# Patient Record
Sex: Female | Born: 1995 | Race: White | Hispanic: No | Marital: Married | Smoking: Never smoker
Health system: Southern US, Community
[De-identification: ages and names within clinical notes are randomized; demographics above are authoritative.]

## PROBLEM LIST (undated history)

## (undated) DIAGNOSIS — Z789 Other specified health status: Secondary | ICD-10-CM

## (undated) HISTORY — DX: Other specified health status: Z78.9

## (undated) HISTORY — PX: TONSILLECTOMY: SUR1361

---
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
## 2015-05-03 ENCOUNTER — Other Ambulatory Visit: Payer: Self-pay | Admitting: Obstetrics and Gynecology

## 2015-05-03 ENCOUNTER — Other Ambulatory Visit (INDEPENDENT_AMBULATORY_CARE_PROVIDER_SITE_OTHER): Payer: No Typology Code available for payment source | Admitting: *Deleted

## 2015-05-03 DIAGNOSIS — N390 Urinary tract infection, site not specified: Secondary | ICD-10-CM

## 2015-05-03 DIAGNOSIS — R300 Dysuria: Secondary | ICD-10-CM

## 2015-05-03 LAB — POCT URINALYSIS DIPSTICK
BILIRUBIN UA: NEGATIVE
Glucose, UA: NEGATIVE
Ketones, UA: NEGATIVE
LEUKOCYTES UA: NEGATIVE
NITRITE UA: POSITIVE
PROTEIN UA: 100
Spec Grav, UA: 1.01
UROBILINOGEN UA: 0.2
pH, UA: 5

## 2015-05-03 MED ORDER — UROGESIC-BLUE 81.6 MG PO TABS: 1.0000 | ORAL_TABLET | Freq: Four times a day (QID) | ORAL | Status: DC | PRN

## 2015-05-03 MED ORDER — NITROFURANTOIN MONOHYD MACRO 100 MG PO CAPS: 100.0000 mg | ORAL_CAPSULE | Freq: Two times a day (BID) | ORAL | Status: DC

## 2015-05-03 NOTE — Progress Notes (Signed)
Pt dropped urine, c/o burning with urination x 4 days, ua was dipped and specimen was sent for culture

## 2015-05-05 LAB — URINE CULTURE

## 2016-07-24 ENCOUNTER — Encounter: Payer: Self-pay | Admitting: Emergency Medicine

## 2016-07-24 ENCOUNTER — Emergency Department: Payer: 59

## 2016-07-24 ENCOUNTER — Emergency Department
Admission: EM | Admit: 2016-07-24 | Discharge: 2016-07-24 | Disposition: A | Discharge status: Home / Self Care | Payer: 59 | Attending: Emergency Medicine | Admitting: Emergency Medicine

## 2016-07-24 DIAGNOSIS — R197 Diarrhea, unspecified: Secondary | ICD-10-CM | POA: Insufficient documentation

## 2016-07-24 DIAGNOSIS — Z79899 Other long term (current) drug therapy: Secondary | ICD-10-CM | POA: Diagnosis not present

## 2016-07-24 DIAGNOSIS — K2970 Gastritis, unspecified, without bleeding: Secondary | ICD-10-CM | POA: Insufficient documentation

## 2016-07-24 DIAGNOSIS — R1013 Epigastric pain: Secondary | ICD-10-CM

## 2016-07-24 LAB — CBC
HEMATOCRIT: 41.7 % (ref 35.0–47.0)
HEMOGLOBIN: 14.6 g/dL (ref 12.0–16.0)
MCH: 30.5 pg (ref 26.0–34.0)
MCHC: 34.9 g/dL (ref 32.0–36.0)
MCV: 87.5 fL (ref 80.0–100.0)
Platelets: 190 10*3/uL (ref 150–440)
RBC: 4.76 MIL/uL (ref 3.80–5.20)
RDW: 13.8 % (ref 11.5–14.5)
WBC: 6.9 10*3/uL (ref 3.6–11.0)

## 2016-07-24 LAB — COMPREHENSIVE METABOLIC PANEL
ALK PHOS: 58 U/L (ref 38–126)
ALT: 23 U/L (ref 14–54)
ANION GAP: 7 (ref 5–15)
AST: 28 U/L (ref 15–41)
Albumin: 4.3 g/dL (ref 3.5–5.0)
BUN: 13 mg/dL (ref 6–20)
CALCIUM: 8.9 mg/dL (ref 8.9–10.3)
CHLORIDE: 107 mmol/L (ref 101–111)
CO2: 24 mmol/L (ref 22–32)
Creatinine, Ser: 0.55 mg/dL (ref 0.44–1.00)
GFR calc non Af Amer: >60 mL/min (ref >60)
Glucose, Bld: 111 mg/dL — ABNORMAL HIGH (ref 65–99)
POTASSIUM: 3.5 mmol/L (ref 3.5–5.1)
SODIUM: 138 mmol/L (ref 135–145)
Total Bilirubin: 0.2 mg/dL — ABNORMAL LOW (ref 0.3–1.2)
Total Protein: 8.1 g/dL (ref 6.5–8.1)

## 2016-07-24 LAB — URINALYSIS COMPLETE WITH MICROSCOPIC (ARMC ONLY)
Bacteria, UA: NONE SEEN
Bilirubin Urine: NEGATIVE
Glucose, UA: NEGATIVE mg/dL
Leukocytes, UA: NEGATIVE
Nitrite: NEGATIVE
PH: 6 (ref 5.0–8.0)
PROTEIN: 30 mg/dL — AB
SPECIFIC GRAVITY, URINE: 1.025 (ref 1.005–1.030)

## 2016-07-24 LAB — LIPASE, BLOOD: LIPASE: 28 U/L (ref 11–51)

## 2016-07-24 MED ORDER — PANTOPRAZOLE SODIUM 20 MG PO TBEC: 20.0000 mg | DELAYED_RELEASE_TABLET | Freq: Every day | ORAL | 1 refills | Status: DC

## 2016-07-24 MED ORDER — GI COCKTAIL ~~LOC~~
30.0000 mL | Freq: Once | ORAL | Status: AC
  Administered 2016-07-24: 30 mL via ORAL
  Filled 2016-07-24: qty 30

## 2016-07-24 NOTE — ED Provider Notes (Signed)
Time Seen: Approximately *1641 I have reviewed the triage notes  Chief Complaint: Abdominal Pain   History of Present Illness: Jodi Sanders is a 20 y.o. female who describes epigastric abdominal pain that started over the weekend. She states Friday she had nausea, vomiting, and some loose stool. She states then the epigastric pain seemed to occur without any obvious melena or hematochezia. Eyes any lower abdominal pain and points exclusively to the epigastric area and states it does hurt her to take a deep breath. She denies any chest pain per se and denies any back or flank discomfort. She denies any shoulder discomfort   No past medical history on file.  There are no active problems to display for this patient.   Past Surgical History:  Procedure Laterality Date  . TONSILLECTOMY      Past Surgical History:  Procedure Laterality Date  . TONSILLECTOMY      Current Outpatient Rx  . Order #: [id number] Class: Normal  . Order #: [id number] Class: Normal  . Order #: [id number] Class: Print    Allergies:  Review of patient's allergies indicates no known allergies.  Family History: No family history on file.  Social History: Social History  Substance Use Topics  . Smoking status: Never Smoker  . Smokeless tobacco: Never Used  . Alcohol use Yes     Comment: occasionally     Review of Systems:   10 point review of systems was performed and was otherwise negative:  Constitutional: Patient felt like she had a fever over the weekend Eyes: No visual disturbances ENT: No sore throat, ear pain Cardiac: No chest pain Respiratory: No shortness of breath, wheezing, or stridor Abdomen: Epigastric pain with no obvious current nausea and vomiting or loose stool. Endocrine: No weight loss, No night sweats Extremities: No peripheral edema, cyanosis Skin: No rashes, easy bruising Neurologic: No focal weakness, trouble with speech or swollowing Urologic: No dysuria, Hematuria,  or urinary frequency Patient denies any foodborne exposure  Physical Exam:  ED Triage Vitals  Enc Vitals Group     BP 07/24/16 1545 (!) 103/59     Pulse Rate 07/24/16 1545 (!) 101     Resp 07/24/16 1545 16     Temp 07/24/16 1545 98.7 F (37.1 C)     Temp Source 07/24/16 1545 Oral     SpO2 07/24/16 1545 98 %     Weight 07/24/16 1546 145 lb (65.8 kg)     Height 07/24/16 1546 5\' 8"  (1.727 m)     Head Circumference --      Peak Flow --      Pain Score 07/24/16 1554 6     Pain Loc --      Pain Edu? --      Excl. in GC? --     General: Awake , Alert , and Oriented times 3; GCS 15 Head: Normal cephalic , atraumatic Eyes: Pupils equal , round, reactive to light Nose/Throat: No nasal drainage, patent upper airway without erythema or exudate.  Neck: Supple, Full range of motion, No anterior adenopathy or palpable thyroid masses Lungs: Clear to ascultation without wheezes , rhonchi, or rales Heart: Regular rate, regular rhythm without murmurs , gallops , or rubs Abdomen: Tender primarily in the epigastric area without rebound, guarding, or rigidity. Bowel sounds are positive and symmetric in all 4 quadrants. Negative Murphy's sign, negative tenderness over McBurney's point      Extremities: 2 plus symmetric pulses. No edema, clubbing or cyanosis Neurologic: normal ambulation,  Motor symmetric without deficits, sensory intact Skin: warm, dry, no rashes   Labs:   All laboratory work was reviewed including any pertinent negatives or positives listed below:  Labs Reviewed  COMPREHENSIVE METABOLIC PANEL - Abnormal; Notable for the following:       Result Value   Glucose, Bld 111 (*)    Total Bilirubin 0.2 (*)    All other components within normal limits  URINALYSIS COMPLETEWITH MICROSCOPIC ([facility] ONLY) - Abnormal; Notable for the following:    Color, Urine YELLOW (*)    APPearance CLEAR (*)    Ketones, ur TRACE (*)    Hgb urine dipstick 2+ (*)    Protein, ur 30 (*)    Squamous  Epithelial / LPF 0-5 (*)    All other components within normal limits  LIPASE, BLOOD  CBC  PREGNANCY, URINE  Laboratory work was reviewed and showed no clinically significant abnormalities.   EKG:  ED ECG REPORT I, Jodi Sanders, the attending physician, personally viewed and interpreted this ECG.  Date: 07/24/2016 EKG Time: 1617 Rate: 99 Rhythm: normal sinus rhythm QRS Axis: normal Intervals: normal ST/T Wave abnormalities: normal Conduction Disturbances: none Narrative Interpretation: unremarkable Normal EKG   Radiology:  "US Abdomen Limited Ruq  Result Date: 07/24/2016 CLINICAL DATA:  Acute epigastric pain. EXAM: US ABDOMEN LIMITED - RIGHT UPPER QUADRANT COMPARISON:  None. FINDINGS: Gallbladder: No gallstones or wall thickening visualized. No sonographic Murphy sign noted by sonographer. Common bile duct: Diameter: 2 mm Liver: No focal lesion identified. Within normal limits in parenchymal echogenicity. IMPRESSION: Normal RIGHT upper quadrant ultrasound. Electronically Signed   By: Awilda Metro M.D.   On: 07/24/2016 17:24  "  I personally reviewed the radiologic studies     ED Course:  Patient's stay here was uneventful and given her differential I felt this was either gastritis versus acute cholecystitis, pancreatitis, etc. Given her clinical presentation and objective findings this most likely is some gastritis after an episode of nausea vomiting and diarrhea. Patient had some relief with a GI cocktail. I felt was unlikely this was an intrathoracic source for the patient's discomfort does not appear to have any evidence of any gastric or duodenal ulcer at this time Clinical Course     Assessment:  Gastritis   Final Clinical Impression  Final diagnoses:  Acute epigastric pain  Epigastric pain  Gastritis     Plan: * Outpatient " New Prescriptions   PANTOPRAZOLE (PROTONIX) 20 MG TABLET    Take 1 tablet (20 mg total) by mouth daily.  " Patient was  advised to avoid ibuprofen and Aleve which she has been taking for pain as this may be making the gastritis worse. She was advised take Tylenol over-the-counter. Patient was advised to return immediately if condition worsens. Patient was advised to follow up with their primary care physician or other specialized physicians involved in their outpatient care. The patient and/or family member/power of attorney had laboratory results reviewed at the bedside. All questions and concerns were addressed and appropriate discharge instructions were distributed by the nursing staff.             Jodi Moccasin, MD 07/24/16 [phone]

## 2016-07-24 NOTE — ED Triage Notes (Signed)
Pt to ED from home c/o epigastric pain since last Thursday.  Pt reports pain today when breathing and a full feeling.  Pt states diarrhea and nausea/vomiting started last week.  Pt states pain is worse after eating.

## 2016-07-24 NOTE — Discharge Instructions (Signed)
Please return immediately if condition worsens. Please contact her primary physician or the physician you were given for referral. If you have any specialist physicians involved in her treatment and plan please also contact them. Thank you for using [facility] [place] [facility].  Return especially for a fever, increasing pain, vomiting blood, or dark tarry looking stool. Over-the-counter Tylenol for pain.

## 2016-08-01 MED ORDER — ALBUTEROL SULFATE (2.5 MG/3ML) 0.083% IN NEBU
INHALATION_SOLUTION | RESPIRATORY_TRACT | Status: AC
  Filled 2016-08-01: qty 3

## 2016-08-07 MED ORDER — LIDOCAINE-EPINEPHRINE-TETRACAINE (LET) SOLUTION
NASAL | Status: AC
  Filled 2016-08-07: qty 3

## 2017-03-28 ENCOUNTER — Encounter: Payer: No Typology Code available for payment source | Admitting: Obstetrics and Gynecology

## 2018-02-18 IMAGING — US US ABDOMEN LIMITED
1 series · 14 of 25 positions shown · non-contrast
Comparison: None.

CLINICAL DATA: Acute epigastric pain.

EXAM:
US ABDOMEN LIMITED - RIGHT UPPER QUADRANT

[Series 1: us abdomen limited · 0.17mm/px · 14 of 44 slices shown]
[im 1/44]
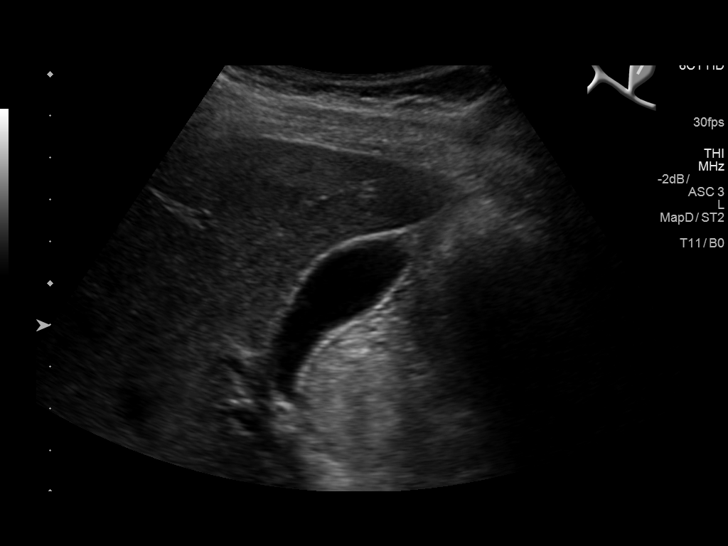
[im 4/44]
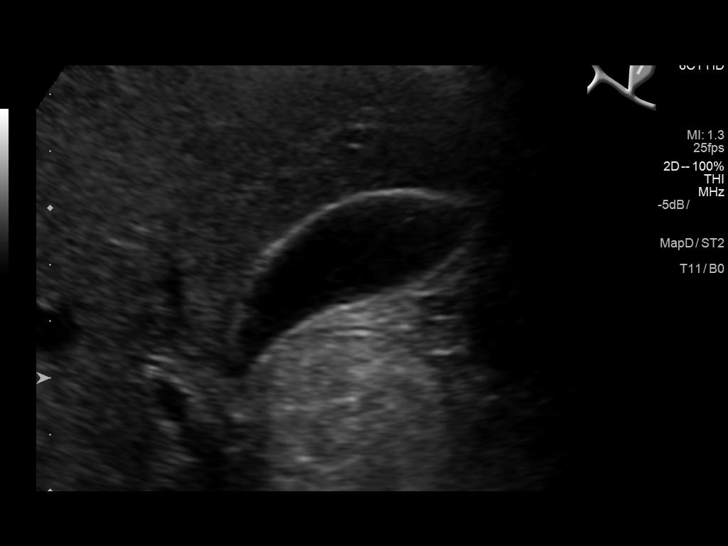
[im 8/44]
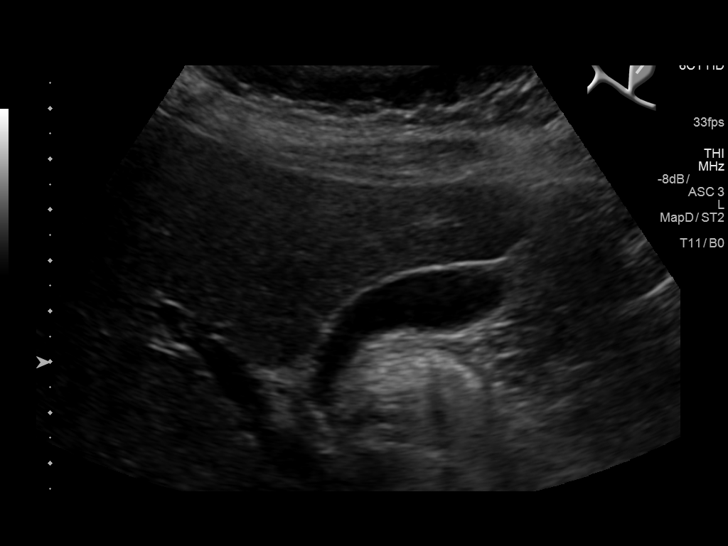
[im 11/44]
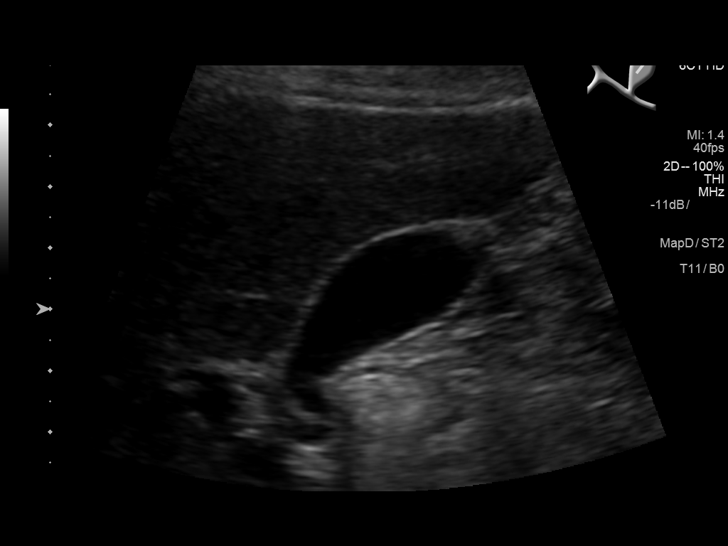
[im 15/44]
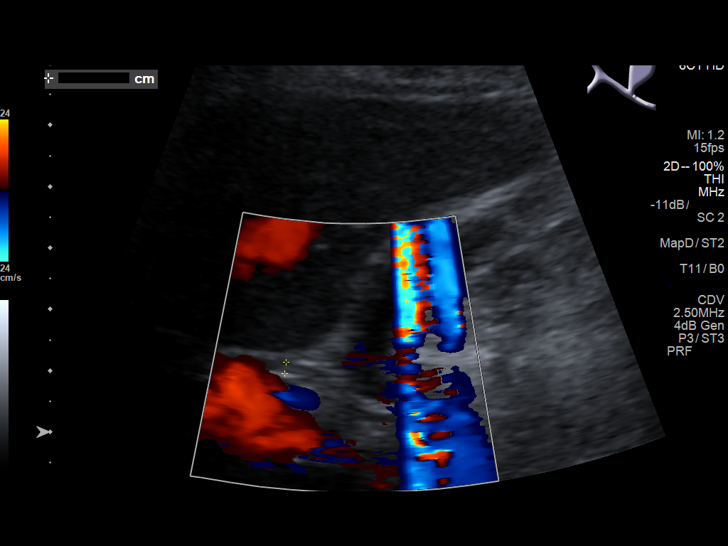
[im 17/44]
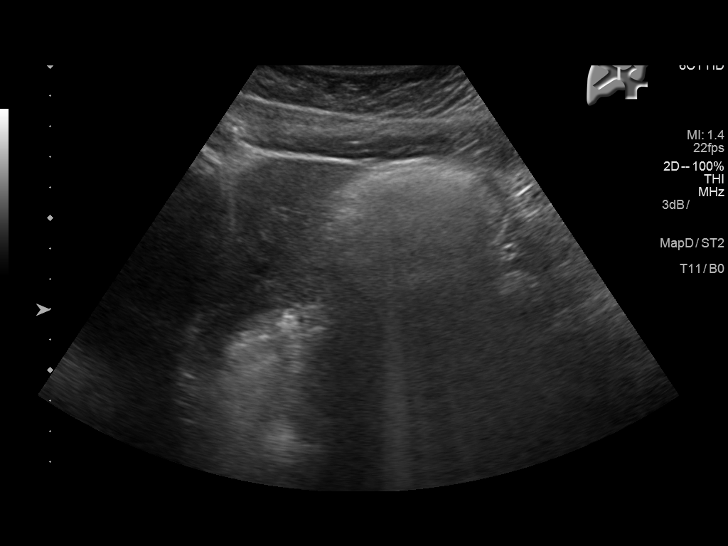
[im 20/44]
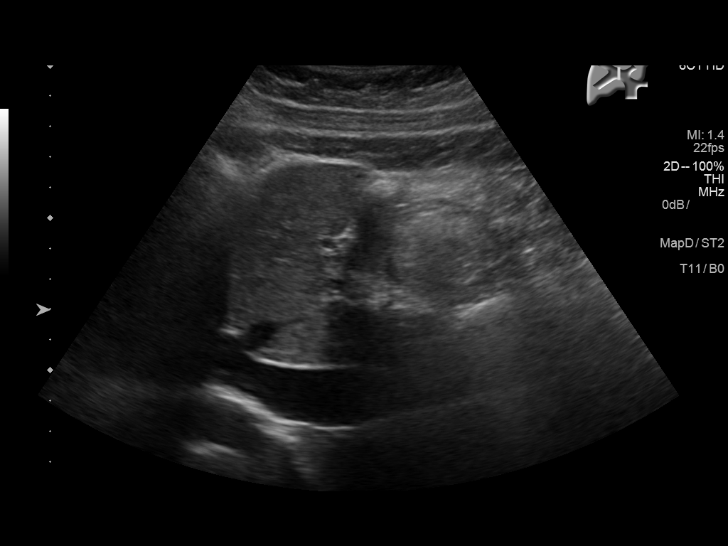
[im 24/44]
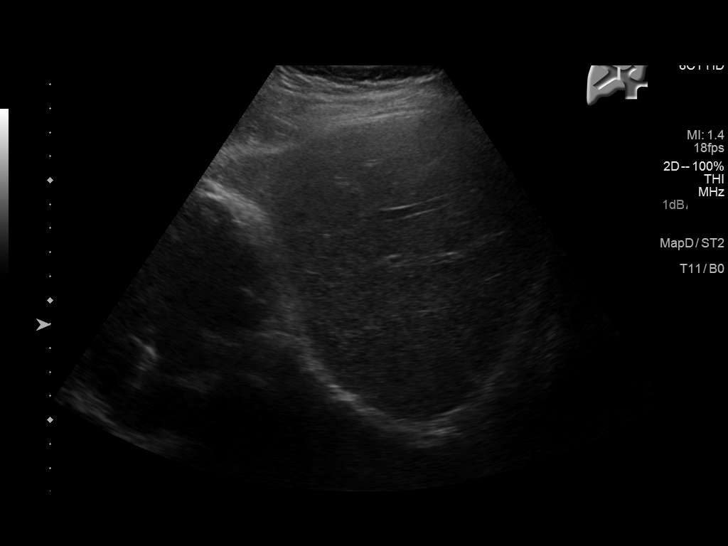
[im 27/44]
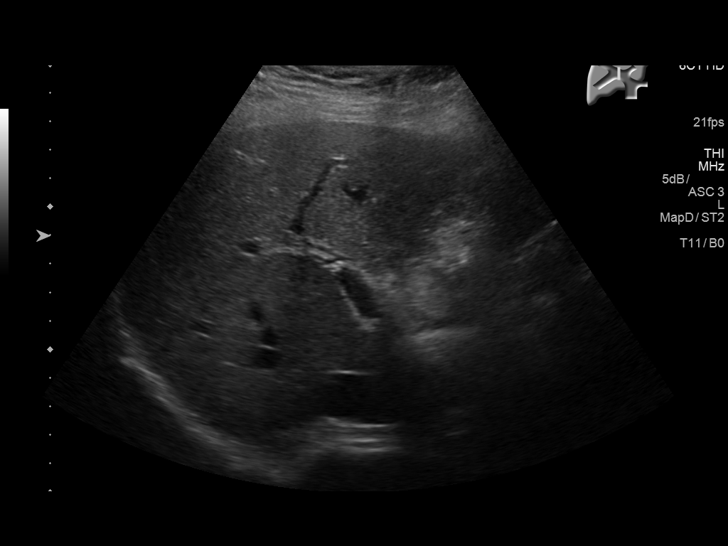
[im 29/44]
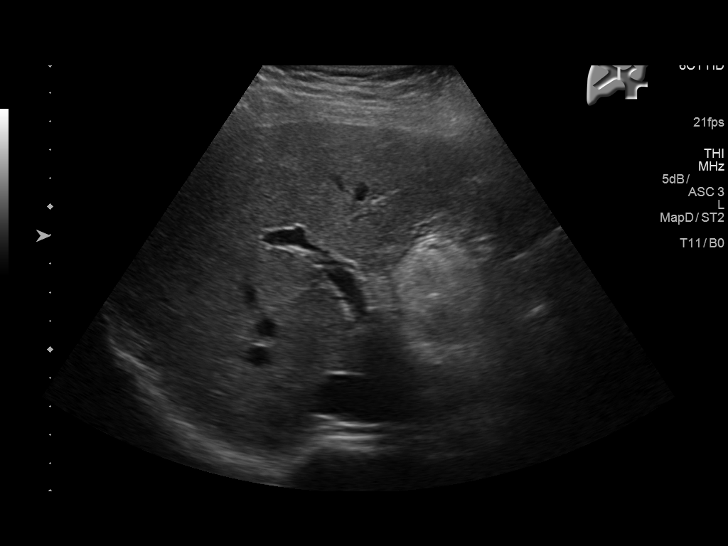
[im 33/44]
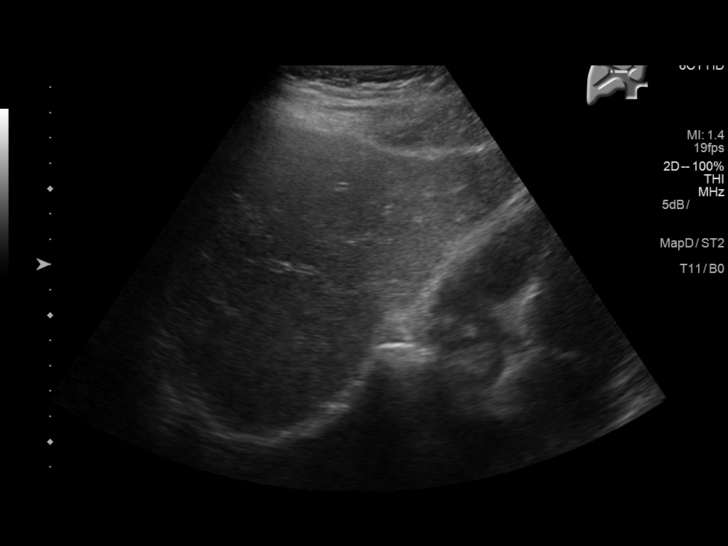
[im 36/44]
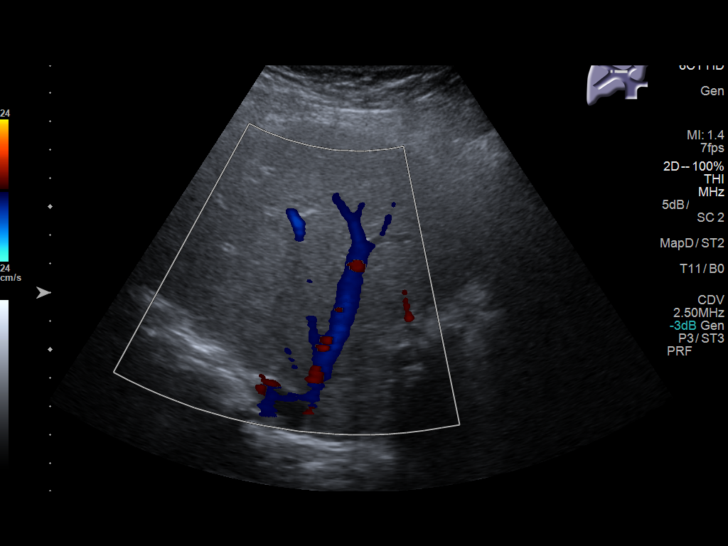
[im 40/44]
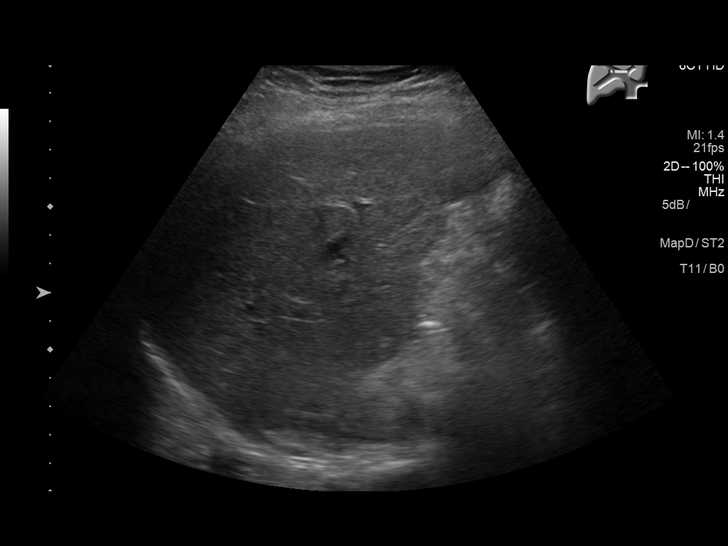
[im 44/44]
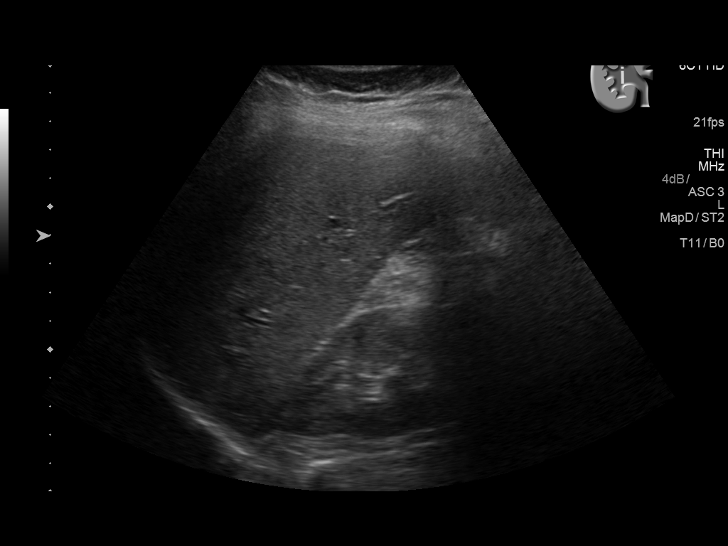

[14 of 25 positions shown; findings below may reference images not displayed]

FINDINGS: Gallbladder:

No gallstones or wall thickening visualized. No sonographic Murphy
sign noted by sonographer.

Common bile duct:

Diameter: 2 mm

Liver:

No focal lesion identified. Within normal limits in parenchymal
echogenicity.
IMPRESSION: Normal RIGHT upper quadrant ultrasound.

## 2019-09-11 ENCOUNTER — Other Ambulatory Visit: Payer: Self-pay

## 2019-09-11 ENCOUNTER — Ambulatory Visit: Payer: 59 | Admitting: Obstetrics and Gynecology

## 2019-09-11 ENCOUNTER — Encounter: Payer: Self-pay | Admitting: Obstetrics and Gynecology

## 2019-09-11 VITALS — BP 92/63 | HR 99 | Ht 68.0 in | Wt 181.0 lb

## 2019-09-11 DIAGNOSIS — Z3049 Encounter for surveillance of other contraceptives: Secondary | ICD-10-CM | POA: Diagnosis not present

## 2019-09-11 DIAGNOSIS — N760 Acute vaginitis: Secondary | ICD-10-CM

## 2019-09-11 DIAGNOSIS — Z3046 Encounter for surveillance of implantable subdermal contraceptive: Secondary | ICD-10-CM

## 2019-09-11 DIAGNOSIS — L732 Hidradenitis suppurativa: Secondary | ICD-10-CM

## 2019-09-11 DIAGNOSIS — B9689 Other specified bacterial agents as the cause of diseases classified elsewhere: Secondary | ICD-10-CM | POA: Diagnosis not present

## 2019-09-11 MED ORDER — SULFAMETHOXAZOLE-TRIMETHOPRIM 800-160 MG PO TABS: 1.0000 | ORAL_TABLET | Freq: Two times a day (BID) | ORAL | 1 refills | Status: DC

## 2019-09-11 MED ORDER — METRONIDAZOLE 500 MG PO TABS: 500.0000 mg | ORAL_TABLET | Freq: Two times a day (BID) | ORAL | 4 refills | Status: DC

## 2019-09-11 NOTE — Patient Instructions (Signed)
Hidradenitis Suppurativa Hidradenitis suppurativa is a long-term (chronic) skin disease. It is similar to a severe form of acne, but it affects areas of the body where acne would be unusual, especially areas of the body where skin rubs against skin and becomes moist. These include:  Underarms.  Groin.  Genital area.  Buttocks.  Upper thighs.  Breasts. Hidradenitis suppurativa may start out as small lumps or pimples caused by blocked sweat glands or hair follicles. Pimples may develop into deep sores that break open (rupture) and drain pus. Over time, affected areas of skin may thicken and become scarred. This condition is rare and does not spread from person to person (non-contagious). What are the causes? The exact cause of this condition is not known. It may be related to:  Female and female hormones.  An overactive disease-fighting system (immune system). The immune system may over-react to blocked hair follicles or sweat glands and cause swelling and pus-filled sores. What increases the risk? You are more likely to develop this condition if you:  Are female.  Are 11-55 years old.  Have a family history of hidradenitis suppurativa.  Have a personal history of acne.  Are overweight.  Smoke.  Take the medicine lithium. What are the signs or symptoms? The first symptoms are usually painful bumps in the skin, similar to pimples. The condition may get worse over time (progress), or it may only cause mild symptoms. If the disease progresses, symptoms may include:  Skin bumps getting bigger and growing deeper into the skin.  Bumps rupturing and draining pus.  Itchy, infected skin.  Skin getting thicker and scarred.  Tunnels under the skin (fistulas) where pus drains from a bump.  Pain during daily activities, such as pain during walking if your groin area is affected.  Emotional problems, such as stress or depression. This condition may affect your appearance and your  ability or willingness to wear certain clothes or do certain activities. How is this diagnosed? This condition is diagnosed by a health care provider who specializes in skin diseases (dermatologist). You may be diagnosed based on:  Your symptoms and medical history.  A physical exam.  Testing a pus sample for infection.  Blood tests. How is this treated? Your treatment will depend on how severe your symptoms are. The same treatment will not work for everybody with this condition. You may need to try several treatments to find what works best for you. Treatment may include:  Cleaning and bandaging (dressing) your wounds as needed.  Lifestyle changes, such as new skin care routines.  Taking medicines, such as: ? Antibiotics. ? Acne medicines. ? Medicines to reduce the activity of the immune system. ? A diabetes medicine (metformin). ? Birth control pills, for women. ? Steroids to reduce swelling and pain.  Working with a mental health care provider, if you experience emotional distress due to this condition. If you have severe symptoms that do not get better with medicine, you may need surgery. Surgery may involve:  Using a laser to clear the skin and remove hair follicles.  Opening and draining deep sores.  Removing the areas of skin that are diseased and scarred. Follow these instructions at home: Medicines   Take over-the-counter and prescription medicines only as told by your health care provider.  If you were prescribed an antibiotic medicine, take it as told by your health care provider. Do not stop taking the antibiotic even if your condition improves. Skin care  If you have open wounds, cover   them with a clean dressing as told by your health care provider. Keep wounds clean by washing them gently with soap and water when you bathe.  Do not shave the areas where you get hidradenitis suppurativa.  Do not wear deodorant.  Wear loose-fitting clothes.  Try to avoid  getting overheated or sweaty. If you get sweaty or wet, change into clean, dry clothes as soon as you can.  To help relieve pain and itchiness, cover sore areas with a warm, clean washcloth (warm compress) for 5-10 minutes as often as needed.  If told by your health care provider, take a bleach bath twice a week: ? Fill your bathtub halfway with water. ? Pour in  cup of unscented household bleach. ? Soak in the tub for 5-10 minutes. ? Only soak from the neck down. Avoid water on your face and hair. ? Shower to rinse off the bleach from your skin. General instructions  Learn as much as you can about your disease so that you have an active role in your treatment. Work closely with your health care provider to find treatments that work for you.  If you are overweight, work with your health care provider to lose weight as recommended.  Do not use any products that contain nicotine or tobacco, such as cigarettes and e-cigarettes. If you need help quitting, ask your health care provider.  If you struggle with living with this condition, talk with your health care provider or work with a mental health care provider as recommended.  Keep all follow-up visits as told by your health care provider. This is important. Where to find more information  Hidradenitis Suppurativa Foundation, Inc.: https://www.hs-foundation.org/ Contact a health care provider if you have:  A flare-up of hidradenitis suppurativa.  A fever or chills.  Trouble controlling your symptoms at home.  Trouble doing your daily activities because of your symptoms.  Trouble dealing with emotional problems related to your condition. Summary  Hidradenitis suppurativa is a long-term (chronic) skin disease. It is similar to a severe form of acne, but it affects areas of the body where acne would be unusual.  The first symptoms are usually painful bumps in the skin, similar to pimples. The condition may get worse over time  (progress), or it may only cause mild symptoms.  If you have open wounds, cover them with a clean dressing as told by your health care provider. Keep wounds clean by washing them gently with soap and water when you bathe.  Besides skin care, treatment may include medicines, laser treatment, and surgery. This information is not intended to replace advice given to you by your health care provider. Make sure you discuss any questions you have with your health care provider. Document Released: 06/19/2004 Document Revised: 11/13/2017 Document Reviewed: 11/13/2017 Elsevier Patient Education  2020 [employer]. Bacterial Vaginosis  Bacterial vaginosis is a vaginal infection that occurs when the normal balance of bacteria in the vagina is disrupted. It results from an overgrowth of certain bacteria. This is the most common vaginal infection among women ages 3815-44. Because bacterial vaginosis increases your risk for STIs (sexually transmitted infections), getting treated can help reduce your risk for chlamydia, gonorrhea, herpes, and HIV (human immunodeficiency virus). Treatment is also important for preventing complications in pregnant women, because this condition can cause an early (premature) delivery. What are the causes? This condition is caused by an increase in harmful bacteria that are normally present in small amounts in the vagina. However, the reason that the  condition develops is not fully understood. What increases the risk? The following factors may make you more likely to develop this condition:  Having a new sexual partner or multiple sexual partners.  Having unprotected sex.  Douching.  Having an intrauterine device (IUD).  Smoking.  Drug and alcohol abuse.  Taking certain antibiotic medicines.  Being pregnant. You cannot get bacterial vaginosis from toilet seats, bedding, swimming pools, or contact with objects around you. What are the signs or symptoms? Symptoms of this  condition include:  Grey or white vaginal discharge. The discharge can also be watery or foamy.  A fish-like odor with discharge, especially after sexual intercourse or during menstruation.  Itching in and around the vagina.  Burning or pain with urination. Some women with bacterial vaginosis have no signs or symptoms. How is this diagnosed? This condition is diagnosed based on:  Your medical history.  A physical exam of the vagina.  Testing a sample of vaginal fluid under a microscope to look for a large amount of bad bacteria or abnormal cells. Your health care provider may use a cotton swab or a small wooden spatula to collect the sample. How is this treated? This condition is treated with antibiotics. These may be given as a pill, a vaginal cream, or a medicine that is put into the vagina (suppository). If the condition comes back after treatment, a second round of antibiotics may be needed. Follow these instructions at home: Medicines  Take over-the-counter and prescription medicines only as told by your health care provider.  Take or use your antibiotic as told by your health care provider. Do not stop taking or using the antibiotic even if you start to feel better. General instructions  If you have a female sexual partner, tell her that you have a vaginal infection. She should see her health care provider and be treated if she has symptoms. If you have a female sexual partner, he does not need treatment.  During treatment: ? Avoid sexual activity until you finish treatment. ? Do not douche. ? Avoid alcohol as directed by your health care provider. ? Avoid breastfeeding as directed by your health care provider.  Drink enough water and fluids to keep your urine clear or pale yellow.  Keep the area around your vagina and rectum clean. ? Wash the area daily with warm water. ? Wipe yourself from front to back after using the toilet.  Keep all follow-up visits as told by your  health care provider. This is important. How is this prevented?  Do not douche.  Wash the outside of your vagina with warm water only.  Use protection when having sex. This includes latex condoms and dental dams.  Limit how many sexual partners you have. To help prevent bacterial vaginosis, it is best to have sex with just one partner (monogamous).  Make sure you and your sexual partner are tested for STIs.  Wear cotton or cotton-lined underwear.  Avoid wearing tight pants and pantyhose, especially during summer.  Limit the amount of alcohol that you drink.  Do not use any products that contain nicotine or tobacco, such as cigarettes and e-cigarettes. If you need help quitting, ask your health care provider.  Do not use illegal drugs. Where to find more information  Centers for Disease Control and Prevention: SolutionApps.co.za  American Sexual Health Association (ASHA): www.ashastd.org  U.S. Department of Health and Health and safety inspector, Office on Women's Health: ConventionalMedicines.si or http://www.anderson-williamson.info/ Contact a health care provider if:  Your symptoms do  not improve, even after treatment.  You have more discharge or pain when urinating.  You have a fever.  You have pain in your abdomen.  You have pain during sex.  You have vaginal bleeding between periods. Summary  Bacterial vaginosis is a vaginal infection that occurs when the normal balance of bacteria in the vagina is disrupted.  Because bacterial vaginosis increases your risk for STIs (sexually transmitted infections), getting treated can help reduce your risk for chlamydia, gonorrhea, herpes, and HIV (human immunodeficiency virus). Treatment is also important for preventing complications in pregnant women, because the condition can cause an early (premature) delivery.  This condition is treated with antibiotic medicines. These may be given as a pill, a vaginal cream, or a  medicine that is put into the vagina (suppository). This information is not intended to replace advice given to you by your health care provider. Make sure you discuss any questions you have with your health care provider. Document Released: 11/05/2005 Document Revised: 10/18/2017 Document Reviewed: 07/21/2016 Elsevier Patient Education  2020 [employer].

## 2019-09-11 NOTE — Progress Notes (Signed)
  Subjective:     Patient ID: Jodi Sanders, female   DOB: 19-Feb-1996, 23 y.o.   MRN: [id number]  HPI Reports vaginal discharge with odor for a few weeks that has went away, and vagina felt funny. Also reports re-occurring cysts on perineum, not new but painful when they occur.  Needs nexplanon replaced as it is past due. Is having regular menses monthly. Is sexually active with female spouse and desires another one placed as they are not ready for children.   Review of Systems  Genitourinary: Positive for vaginal discharge.  All other systems reviewed and are negative.      Objective:   Physical Exam A&Ox4 Well groomed female  Blood pressure 92/63, pulse 99, height 5\' 8"  (1.727 m), weight 181 lb (82.1 kg).  Body mass index is 27.52 kg/m.  Pelvic exam: normal external genitalia, vulva, vagina, cervix, uterus and adnexa, VULVA: vulvar lesion c/w scars from hidradenitis lesions scattered on buttucks and groin, VAGINA: normal appearing vagina with normal color and discharge, no lesions, WET MOUNT done - results: clue cells, excessive bacteria.    Assessment:     BV Hidradenitis nexplanon removed and new reinserted.    Plan:     Flagyl 500mg  bid x 1 week. rx sent in for bactrim use as needed with hidradenitis flare ups. nexplanon removal & insertion note:   Jodi Sanders is a 23 y.o. year old No obstetric history on file. Caucasian female here for Nexplanon removal and reinsertion.  She was given informed consent for removal and reinsertion of her Nexplanon. Her Nexplanon was placed 2016, No LMP recorded. Patient has had an implant., and her pregnancy test today was negative.   Risks/benefits/side effects of Nexplanon have been discussed and her questions have been answered.  Specifically, a failure rate of 11/998 has been reported, with an increased failure rate if pt takes Duchesne and/or antiseizure medicaitons.  Jodi Sanders is aware of the common side effect of  irregular bleeding, which the incidence of decreases over time.  BP 92/63   Pulse 99   Ht 5\' 8"  (1.727 m)   Wt 181 lb (82.1 kg)   BMI 27.52 kg/m  No LMP recorded. Patient has had an implant. No results found for this or any previous visit (from the past 24 hour(s)).   Appropriate time out taken. Nexplanon site identified.  Area prepped in usual sterile fashon. Two cc's of 2% lidocaine was used to anesthetize the area. A small stab incision was made right beside the implant on the distal portion.  The Nexplanon rod was grasped using hemostats and removed intact without difficulty.  The area was cleansed again with betadine and the Nexplanon was inserted per manufacturer's recommendations without difficulty.  Steri-strips and a pressure bandage was applied.  There was less than 3 cc blood loss. There were no complications.  The patient tolerated the procedure well.  She was instructed to keep the area clean and dry, remove pressure bandage in 24 hours, and keep insertion site covered with the steri-strips for 3-5 days.  She was given a card indicating date Nexplanon was inserted and date it needs to be removed.   Follow-up PRN problems.  Saiya Crist Rockney Ghee, CNM    RTC in 4-6 weeks for AE and pap as it is over due.   Marteze Vecchio,CNM

## 2019-09-11 NOTE — Progress Notes (Signed)
Patient is here with c/o vaginal odor - fishy smelling with some whitish discharge. Has resolved. Also c/o small cysts in vaginal area. Refused flu shot

## 2019-10-14 ENCOUNTER — Encounter: Payer: No Typology Code available for payment source | Admitting: Obstetrics and Gynecology

## 2021-02-06 ENCOUNTER — Encounter: Payer: Self-pay | Admitting: Certified Nurse Midwife

## 2021-02-06 ENCOUNTER — Ambulatory Visit (INDEPENDENT_AMBULATORY_CARE_PROVIDER_SITE_OTHER): Payer: No Typology Code available for payment source | Admitting: Certified Nurse Midwife

## 2021-02-06 ENCOUNTER — Other Ambulatory Visit: Payer: Self-pay

## 2021-02-06 VITALS — BP 107/68 | HR 98 | Ht 68.0 in | Wt 150.8 lb

## 2021-02-06 DIAGNOSIS — Z3046 Encounter for surveillance of implantable subdermal contraceptive: Secondary | ICD-10-CM | POA: Diagnosis not present

## 2021-02-06 NOTE — Patient Instructions (Signed)
Nexplanon Instructions   Keep bandage clean and dry for 24 hours  May use ice/Tylenol/Ibuprofen for soreness or pain  If you develop fever, drainage or increased warmth from incision site-contact office immediately   

## 2021-02-06 NOTE — Progress Notes (Addendum)
Jodi Sanders is a 25 y.o. year old No obstetric history on file. Caucasian female here for Nexplanon removal .  She was given informed consent for removal .   BP 107/68   Pulse 98   Ht 5\' 8"  (1.727 m)   Wt 150 lb 12.8 oz (68.4 kg)   BMI 22.93 kg/m  No LMP recorded. Patient has had an implant. No results found for this or any previous visit (from the past 24 hour(s)).   Appropriate time out taken. Nexplanon site identified.  Area prepped in usual sterile fashon. Two cc's of 2% lidocaine was used to anesthetize the area. A small stab incision was made right beside the implant on the distal portion.  The Nexplanon rod was grasped using hemostats and removed intact without difficulty.  Steri-strips and a pressure bandage was applied.  There was less than 3 cc blood loss. There were no complications.  The patient tolerated the procedure well.  She was instructed to keep the area clean and dry, remove pressure bandage in 24 hours, and keep insertion site covered with the steri-strips for 3-5 days. She declines birth control at this time. She is ok with pregnancy.   Follow-up PRN problems. , CNM

## 2021-02-06 NOTE — Progress Notes (Signed)
Pt present for Nexplanon removal. Pt stated that she would like her Nexplanon removed due to prolonged cycles. Pt stated that her cycles start and continue for days.

## 2021-03-15 ENCOUNTER — Other Ambulatory Visit: Payer: Self-pay

## 2021-03-15 ENCOUNTER — Encounter: Payer: No Typology Code available for payment source | Admitting: Certified Nurse Midwife

## 2021-03-21 ENCOUNTER — Encounter: Payer: Self-pay | Admitting: Certified Nurse Midwife

## 2021-03-21 ENCOUNTER — Other Ambulatory Visit (HOSPITAL_COMMUNITY)
Admission: RE | Admit: 2021-03-21 | Discharge: 2021-03-21 | Disposition: A | Discharge status: Home / Self Care | Payer: 59 | Source: Ambulatory Visit | Attending: Certified Nurse Midwife | Admitting: Certified Nurse Midwife

## 2021-03-21 ENCOUNTER — Ambulatory Visit (INDEPENDENT_AMBULATORY_CARE_PROVIDER_SITE_OTHER): Payer: 59 | Admitting: Certified Nurse Midwife

## 2021-03-21 ENCOUNTER — Other Ambulatory Visit: Payer: Self-pay

## 2021-03-21 VITALS — BP 106/61 | HR 81 | Resp 16 | Ht 66.54 in | Wt 148.6 lb

## 2021-03-21 DIAGNOSIS — N898 Other specified noninflammatory disorders of vagina: Secondary | ICD-10-CM | POA: Diagnosis not present

## 2021-03-21 DIAGNOSIS — Z01419 Encounter for gynecological examination (general) (routine) without abnormal findings: Secondary | ICD-10-CM

## 2021-03-21 DIAGNOSIS — Z124 Encounter for screening for malignant neoplasm of cervix: Secondary | ICD-10-CM

## 2021-03-21 NOTE — Progress Notes (Signed)
GYNECOLOGY ANNUAL PREVENTATIVE CARE ENCOUNTER NOTE  History:     Jodi Sanders is a 25 y.o. G0P0000 female here for a routine annual gynecologic exam.  Current complaints: vaginal discharge.   Denies abnormal vaginal bleeding, discharge, pelvic pain, problems with intercourse or other gynecologic concerns.     Social Relationship: Married Living: with spouse and 2 dogs /cat Work: [employer] Exercise: few times a month  Smoke/Alcohol/drug use: vapes daily -not sure how many time day, occasional alcohol use, denies drug use   Gynecologic History Patient's last menstrual period was 03/07/2021 (approximate). Contraception: condoms ( had nexplanon)  Last Pap: has not had one Last mammogram: n/a  Upstream - 03/21/21 1510      Pregnancy Intention Screening   Does the patient want to become pregnant in the next year? Unsure    Does the patient's partner want to become pregnant in the next year? Unsure    Would the patient like to discuss contraceptive options today? No          The pregnancy intention screening data noted above was reviewed. Potential methods of contraception were discussed. The patient elected to proceed with Female Condom.   Obstetric History OB History  Gravida Para Term Preterm AB Living  0 0 0 0 0 0  SAB IAB Ectopic Multiple Live Births  0 0 0 0 0    Past Medical History:  Diagnosis Date  . No pertinent past medical history     Past Surgical History:  Procedure Laterality Date  . TONSILLECTOMY      No current outpatient medications on file prior to visit.   No current facility-administered medications on file prior to visit.    No Known Allergies  Social History:  reports that she has never smoked. She has never used smokeless tobacco. She reports current alcohol use. She reports that she does not use drugs.  Family History  Problem Relation Age of Onset  . Healthy Mother   . Healthy Father     The following  portions of the patient's history were reviewed and updated as appropriate: allergies, current medications, past family history, past medical history, past social history, past surgical history and problem list.  Review of Systems Pertinent items noted in HPI and remainder of comprehensive ROS otherwise negative.  Physical Exam:  BP 106/61   Pulse 81   Resp 16   Ht 5' 6.54" (1.69 m)   Wt 148 lb 9.6 oz (67.4 kg)   LMP 03/07/2021 (Approximate)   BMI 23.60 kg/m  CONSTITUTIONAL: Well-developed, well-nourished female in no acute distress.  HENT:  Normocephalic, atraumatic, External right and left ear normal. Oropharynx is clear and moist EYES: Conjunctivae and EOM are normal. Pupils are equal, round, and reactive to light. No scleral icterus.  NECK: Normal range of motion, supple, no masses.  Normal thyroid.  SKIN: Skin is warm and dry. No rash noted. Not diaphoretic. No erythema. No pallor. MUSCULOSKELETAL: Normal range of motion. No tenderness.  No cyanosis, clubbing, or edema.  2+ distal pulses. NEUROLOGIC: Alert and oriented to person, place, and time. Normal reflexes, muscle tone coordination.  PSYCHIATRIC: Normal mood and affect. Normal behavior. Normal judgment and thought content. CARDIOVASCULAR: Normal heart rate noted, regular rhythm RESPIRATORY: Clear to auscultation bilaterally. Effort and breath sounds normal, no problems with respiration noted. BREASTS: Symmetric in size. No masses, tenderness, skin changes, nipple drainage, or lymphadenopathy bilaterally. Bilateral fibrocystic tissue upper outer quadrants. Mole on right areola-regular borders,  light brown color.  ABDOMEN: Soft, no distention noted.  No tenderness, rebound or guarding.  PELVIC: Normal appearing external genitalia and urethral meatus; normal appearing vaginal mucosa and cervix.White thick particulate discharge noted.  Pap smear obtained.  Normal uterine size, no other palpable masses, no uterine or adnexal  tenderness.  .   Assessment and Plan:    1. Women's annual routine gynecological examination   Pap:Will follow up results of pap smear and manage accordingly Mammogram : n/a Labs: pt declines blood work today. Vaginal swab collected yeast/bv  Refills:none Referral:none Routine preventative health maintenance measures emphasized. Please refer to After Visit Summary for other counseling recommendations.      Doreene Burke, CNM [facility],  [facility]

## 2021-03-21 NOTE — Patient Instructions (Signed)
Preventive Care 21-26 Years Old, Female Preventive care refers to lifestyle choices and visits with your health care provider that can promote health and wellness. This includes:  A yearly physical exam. This is also called an annual wellness visit.  Regular dental and eye exams.  Immunizations.  Screening for certain conditions.  Healthy lifestyle choices, such as: ? Eating a healthy diet. ? Getting regular exercise. ? Not using drugs or products that contain nicotine and tobacco. ? Limiting alcohol use. What can I expect for my preventive care visit? Physical exam Your health care provider may check your:  Height and weight. These may be used to calculate your BMI (body mass index). BMI is a measurement that tells if you are at a healthy weight.  Heart rate and blood pressure.  Body temperature.  Skin for abnormal spots. Counseling Your health care provider may ask you questions about your:  Past medical problems.  Family's medical history.  Alcohol, tobacco, and drug use.  Emotional well-being.  Home life and relationship well-being.  Sexual activity.  Diet, exercise, and sleep habits.  Work and work environment.  Access to firearms.  Method of birth control.  Menstrual cycle.  Pregnancy history. What immunizations do I need? Vaccines are usually given at various ages, according to a schedule. Your health care provider will recommend vaccines for you based on your age, medical history, and lifestyle or other factors, such as travel or where you work.   What tests do I need? Blood tests  Lipid and cholesterol levels. These may be checked every 5 years starting at age 20.  Hepatitis C test.  Hepatitis B test. Screening  Diabetes screening. This is done by checking your blood sugar (glucose) after you have not eaten for a while (fasting).  STD (sexually transmitted disease) testing, if you are at risk.  BRCA-related cancer screening. This may be  done if you have a family history of breast, ovarian, tubal, or peritoneal cancers.  Pelvic exam and Pap test. This may be done every 3 years starting at age 21. Starting at age 30, this may be done every 5 years if you have a Pap test in combination with an HPV test. Talk with your health care provider about your test results, treatment options, and if necessary, the need for more tests.   Follow these instructions at home: Eating and drinking  Eat a healthy diet that includes fresh fruits and vegetables, whole grains, lean protein, and low-fat dairy products.  Take vitamin and mineral supplements as recommended by your health care provider.  Do not drink alcohol if: ? Your health care provider tells you not to drink. ? You are pregnant, may be pregnant, or are planning to become pregnant.  If you drink alcohol: ? Limit how much you have to 0-1 drink a day. ? Be aware of how much alcohol is in your drink. In the [place] one drink equals one 12 oz bottle of beer (355 mL), one 5 oz glass of wine (148 mL), or one 1 oz glass of hard liquor (44 mL).   Lifestyle  Take daily care of your teeth and gums. Brush your teeth every morning and night with fluoride toothpaste. Floss one time each day.  Stay active. Exercise for at least 30 minutes 5 or more days each week.  Do not use any products that contain nicotine or tobacco, such as cigarettes, e-cigarettes, and chewing tobacco. If you need help quitting, ask your health care provider.  Do not   use drugs.  If you are sexually active, practice safe sex. Use a condom or other form of protection to prevent STIs (sexually transmitted infections).  If you do not wish to become pregnant, use a form of birth control. If you plan to become pregnant, see your health care provider for a prepregnancy visit.  Find healthy ways to cope with stress, such as: ? Meditation, yoga, or listening to music. ? Journaling. ? Talking to a trusted  person. ? Spending time with friends and family. Safety  Always wear your seat belt while driving or riding in a vehicle.  Do not drive: ? If you have been drinking alcohol. Do not ride with someone who has been drinking. ? When you are tired or distracted. ? While texting.  Wear a helmet and other protective equipment during sports activities.  If you have firearms in your house, make sure you follow all gun safety procedures.  Seek help if you have been physically or sexually abused. What's next?  Go to your health care provider once a year for an annual wellness visit.  Ask your health care provider how often you should have your eyes and teeth checked.  Stay up to date on all vaccines. This information is not intended to replace advice given to you by your health care provider. Make sure you discuss any questions you have with your health care provider. Document Revised: 07/03/2020 Document Reviewed: 07/17/2018 Elsevier Patient Education  2021 Elsevier Inc.  

## 2021-03-24 LAB — CYTOLOGY - PAP: Diagnosis: NEGATIVE

## 2021-03-27 ENCOUNTER — Encounter: Payer: Self-pay | Admitting: Certified Nurse Midwife

## 2021-03-27 ENCOUNTER — Other Ambulatory Visit: Payer: Self-pay

## 2021-03-27 LAB — CERVICOVAGINAL ANCILLARY ONLY
Bacterial Vaginitis (gardnerella): POSITIVE — AB
Comment: NEGATIVE

## 2021-03-27 MED ORDER — METRONIDAZOLE 0.75 % VA GEL: 1.0000 | Freq: Every day | VAGINAL | 0 refills | Status: DC

## 2021-04-11 ENCOUNTER — Encounter: Payer: No Typology Code available for payment source | Admitting: Certified Nurse Midwife

## 2021-04-18 ENCOUNTER — Other Ambulatory Visit (HOSPITAL_COMMUNITY)
Admission: RE | Admit: 2021-04-18 | Discharge: 2021-04-18 | Disposition: A | Discharge status: Home / Self Care | Payer: 59 | Source: Ambulatory Visit | Attending: Certified Nurse Midwife | Admitting: Certified Nurse Midwife

## 2021-04-18 ENCOUNTER — Ambulatory Visit: Payer: 59 | Admitting: Certified Nurse Midwife

## 2021-04-18 ENCOUNTER — Other Ambulatory Visit: Payer: Self-pay

## 2021-04-18 ENCOUNTER — Encounter: Payer: Self-pay | Admitting: Certified Nurse Midwife

## 2021-04-18 VITALS — BP 106/67 | HR 80 | Resp 16 | Ht 66.5 in | Wt 144.3 lb

## 2021-04-18 DIAGNOSIS — N898 Other specified noninflammatory disorders of vagina: Secondary | ICD-10-CM | POA: Diagnosis not present

## 2021-04-18 NOTE — Progress Notes (Signed)
GYN ENCOUNTER NOTE  Subjective:       Jodi Sanders is a 25 y.o. G0P0000 female is here for gynecologic evaluation of the following issues:  1. Pt was seen 03/21/21 for AE , she had swab completed that was inconclusive. Presents today for repeat swab.    Gynecologic History No LMP recorded. Contraception: condoms Last Pap: 03/21/2021 Results were: normal Last mammogram: n/a  Obstetric History OB History  Gravida Para Term Preterm AB Living  0 0 0 0 0 0  SAB IAB Ectopic Multiple Live Births  0 0 0 0 0    Past Medical History:  Diagnosis Date  . No pertinent past medical history     Past Surgical History:  Procedure Laterality Date  . TONSILLECTOMY      Current Outpatient Medications on File Prior to Visit  Medication Sig Dispense Refill  . metroNIDAZOLE (METROGEL VAGINAL) 0.75 % vaginal gel Place 1 Applicatorful vaginally at bedtime. For 5 nights 70 g 0   No current facility-administered medications on file prior to visit.    No Known Allergies  Social History   Socioeconomic History  . Marital status: Single    Spouse name: Not on file  . Number of children: Not on file  . Years of education: Not on file  . Highest education level: Not on file  Occupational History  . Not on file  Tobacco Use  . Smoking status: Never Smoker  . Smokeless tobacco: Never Used  Vaping Use  . Vaping Use: Every day  Substance and Sexual Activity  . Alcohol use: Yes    Comment: occasionally  . Drug use: No  . Sexual activity: Yes    Birth control/protection: None  Other Topics Concern  . Not on file  Social History Narrative  . Not on file   Social Determinants of Health   Financial Resource Strain: Not on file  Food Insecurity: Not on file  Transportation Needs: Not on file  Physical Activity: Not on file  Stress: Not on file  Social Connections: Not on file  Intimate Partner Violence: Not on file    Family History  Problem Relation Age of Onset  . Healthy  Mother   . Healthy Father     The following portions of the patient's history were reviewed and updated as appropriate: allergies, current medications, past family history, past medical history, past social history, past surgical history and problem list.  Review of Systems Review of Systems - Negative except as mentioned in HPI Review of Systems - General ROS: negative for - chills, fatigue, fever, hot flashes, malaise or night sweats Hematological and Lymphatic ROS: negative for - bleeding problems or swollen lymph nodes Gastrointestinal ROS: negative for - abdominal pain, blood in stools, change in bowel habits and nausea/vomiting Musculoskeletal ROS: negative for - joint pain, muscle pain or muscular weakness Genito-Urinary ROS: negative for - change in menstrual cycle, dysmenorrhea, dyspareunia, dysuria,  genital ulcers, hematuria, incontinence, irregular/heavy menses, nocturia or pelvic pain. Positive for clumpy discharge.   Objective:   BP 106/67   Pulse 80   Resp 16   Ht 5' 6.5" (1.689 m)   Wt 144 lb 4.8 oz (65.5 kg)   BMI 22.94 kg/m  CONSTITUTIONAL: Well-developed, well-nourished female in no acute distress.  HENT:  Normocephalic, atraumatic.  NECK: Normal range of motion, supple, no masses.  Normal thyroid.  SKIN: Skin is warm and dry. No rash noted. Not diaphoretic. No erythema. No pallor. NEUROLGIC: Alert and oriented  to person, place, and time. PSYCHIATRIC: Normal mood and affect. Normal behavior. Normal judgment and thought content. CARDIOVASCULAR:Not Examined RESPIRATORY: Not Examined BREASTS: Not Examined ABDOMEN: Soft, non distended; Non tender.  No Organomegaly. PELVIC:not done pt self swab  MUSCULOSKELETAL: Normal range of motion. No tenderness.  No cyanosis, clubbing, or edema.     Assessment:   Vaginal discharge   Plan:   Will follow up with results. Pt may try boric acid or over the counter monistat .   Doreene Burke, CNM

## 2021-04-20 LAB — CERVICOVAGINAL ANCILLARY ONLY
Bacterial Vaginitis (gardnerella): NEGATIVE
Candida Glabrata: NEGATIVE
Candida Vaginitis: POSITIVE — AB
Comment: NEGATIVE
Comment: NEGATIVE
Comment: NEGATIVE

## 2021-04-24 ENCOUNTER — Other Ambulatory Visit: Payer: Self-pay | Admitting: Certified Nurse Midwife

## 2021-04-24 MED ORDER — FLUCONAZOLE 150 MG PO TABS: 150.0000 mg | ORAL_TABLET | Freq: Once | ORAL | 0 refills | Status: AC

## 2021-04-27 ENCOUNTER — Other Ambulatory Visit: Payer: Self-pay | Admitting: Certified Nurse Midwife

## 2021-04-27 ENCOUNTER — Telehealth: Payer: Self-pay | Admitting: Certified Nurse Midwife

## 2021-04-27 MED ORDER — FLUCONAZOLE 150 MG PO TABS: 150.0000 mg | ORAL_TABLET | Freq: Once | ORAL | 0 refills | Status: AC

## 2021-04-27 NOTE — Telephone Encounter (Signed)
Pt called stated that she filled her rx from Monday but misplaced the medication. Please Advise. RX: Diflucan 150 mg Pharm: [facility] [address]

## 2021-04-28 NOTE — Telephone Encounter (Signed)
Patient called.  Patient aware.  

## 2021-05-23 ENCOUNTER — Other Ambulatory Visit: Payer: Self-pay

## 2021-05-23 ENCOUNTER — Ambulatory Visit (INDEPENDENT_AMBULATORY_CARE_PROVIDER_SITE_OTHER): Payer: 59 | Admitting: Certified Nurse Midwife

## 2021-05-23 ENCOUNTER — Encounter: Payer: Self-pay | Admitting: Certified Nurse Midwife

## 2021-05-23 VITALS — BP 104/68 | HR 76 | Ht 67.0 in | Wt 145.8 lb

## 2021-05-23 DIAGNOSIS — N912 Amenorrhea, unspecified: Secondary | ICD-10-CM | POA: Diagnosis not present

## 2021-05-23 LAB — POCT URINE PREGNANCY: Preg Test, Ur: POSITIVE — AB

## 2021-05-23 NOTE — Patient Instructions (Signed)
https://www.[facility].org/womens-health/faqs/prenatal-genetic-screening-tests">  Prenatal Care Prenatal care is health care during pregnancy. It helps you and your unborn baby (fetus) stay as healthy as possible. Prenatal care may be provided by a midwife, a family practice doctor, a mid-level practitioner (nurse practitioner or physician assistant), or a childbirth and pregnancy doctor (obstetrician). How does this affect me? During pregnancy, you will be closely monitored for any new conditions that might develop. To lower your risk of pregnancy complications, you and yourhealth care provider will talk about any underlying conditions you have. How does this affect my baby? Early and consistent prenatal care increases the chance that your baby will be healthy during pregnancy. Prenatal care lowers the risk that your baby will be: Born early (prematurely). Smaller than expected at birth (small for gestational age). What can I expect at the first prenatal care visit? Your first prenatal care visit will likely be the longest. You should schedule your first prenatal care visit as soon as you know that you are pregnant. Your first visit is a good time to talk about any questions or concerns you haveabout pregnancy. Medical history At your visit, you and your health care provider will talk about your medical history, including: Any past pregnancies. Your family's medical history. Medical history of the baby's father. Any long-term (chronic) health conditions you have and how you manage them. Any surgeries or procedures you have had. Any current over-the-counter or prescription medicines, herbs, or supplements that you are taking. Other factors that could pose a risk to your baby, including: Exposure to harmful chemicals or radiation at work or at home. Any substance use, including tobacco, alcohol, and drug use. Your home setting and your stress levels, including: Exposure to abuse or  violence. Household financial strain. Your daily health habits, including diet and exercise. Tests and screenings Your health care provider will: Measure your weight, height, and blood pressure. Do a physical exam, including a pelvic and breast exam. Perform blood tests and urine tests to check for: Urinary tract infection. Sexually transmitted infections (STIs). Low iron levels in your blood (anemia). Blood type and certain proteins on red blood cells (Rh antibodies). Infections and immunity to viruses, such as hepatitis B and rubella. HIV (human immunodeficiency virus). Discuss your options for genetic screening. Tips about staying healthy Your health care provider will also give you information about how to keep yourself and your baby healthy, including: Nutrition and taking vitamins. Physical activity. How to manage pregnancy symptoms such as nausea and vomiting (morning sickness). Infections and substances that may be harmful to your baby and how to avoid them. Food safety. Dental care. Working. Travel. Warning signs to watch for and when to call your health care provider. How often will I have prenatal care visits? After your first prenatal care visit, you will have regular visits throughout your pregnancy. The visit schedule is often as follows: Up to week 28 of pregnancy: once every 4 weeks. 28-36 weeks: once every 2 weeks. After 36 weeks: every week until delivery. Some women may have visits more or less often depending on any underlyinghealth conditions and the health of the baby. Keep all follow-up and prenatal care visits. This is important. What happens during routine prenatal care visits? Your health care provider will: Measure your weight and blood pressure. Check for fetal heart sounds. Measure the height of your uterus in your abdomen (fundal height). This may be measured starting around week 20 of pregnancy. Check the position of your baby inside your  uterus. Ask questions   about your diet, sleeping patterns, and whether you can feel the baby move. Review warning signs to watch for and signs of labor. Ask about any pregnancy symptoms you are having and how you are dealing with them. Symptoms may include: Headaches. Nausea and vomiting. Vaginal discharge. Swelling. Fatigue. Constipation. Changes in your vision. Feeling persistently sad or anxious. Any discomfort, including back or pelvic pain. Bleeding or spotting. Make a list of questions to ask your health care provider at your routinevisits. What tests might I have during prenatal care visits? You may have blood, urine, and imaging tests throughout your pregnancy, such as: Urine tests to check for glucose, protein, or signs of infection. Glucose tests to check for a form of diabetes that can develop during pregnancy (gestational diabetes mellitus). This is usually done around week 24 of pregnancy. Ultrasounds to check your baby's growth and development, to check for birth defects, and to check your baby's well-being. These can also help to decide when you should deliver your baby. A test to check for group B strep (GBS) infection. This is usually done around week 36 of pregnancy. Genetic testing. This may include blood, fluid, or tissue sampling, or imaging tests, such as an ultrasound. Some genetic tests are done during the first trimester and some are done during the second trimester. What else can I expect during prenatal care visits? Your health care provider may recommend getting certain vaccines during pregnancy. These may include: A yearly flu shot (annual influenza vaccine). This is especially important if you will be pregnant during flu season. Tdap (tetanus, diphtheria, pertussis) vaccine. Getting this vaccine during pregnancy can protect your baby from whooping cough (pertussis) after birth. This vaccine may be recommended between weeks 27 and 36 of pregnancy. A COVID-19  vaccine. Later in your pregnancy, your health care provider may give you information about: Childbirth and breastfeeding classes. Choosing a health care provider for your baby. Umbilical cord banking. Breastfeeding. Birth control after your baby is born. The hospital labor and delivery unit and how to set up a tour. Registering at the hospital before you go into labor. Where to find more information Office on Women's Health: womenshealth.gov American Pregnancy Association: americanpregnancy.org March of Dimes: marchofdimes.org Summary Prenatal care helps you and your baby stay as healthy as possible during pregnancy. Your first prenatal care visit will most likely be the longest. You will have visits and tests throughout your pregnancy to monitor your health and your baby's health. Bring a list of questions to your visits to ask your health care provider. Make sure to keep all follow-up and prenatal care visits. This information is not intended to replace advice given to you by your health care provider. Make sure you discuss any questions you have with your healthcare provider. Document Revised: 08/18/2020 Document Reviewed: 08/18/2020 Elsevier Patient Education  2022 Elsevier Inc.  

## 2021-05-23 NOTE — Progress Notes (Signed)
Subjective:    ADRINNE Sanders is a 25 y.o. female who presents for evaluation of amenorrhea. She believes she could be pregnant. Pregnancy is desired. Sexual Activity: single partner, contraception: none. Current symptoms also include: nausea. Last period was unsure of date.   Patient's last menstrual period was 03/31/2021 (approximate). The following portions of the patient's history were reviewed and updated as appropriate: allergies, current medications, past family history, past medical history, past social history, past surgical history, and problem list.  Review of Systems Pertinent items are noted in HPI.     Objective:    BP 104/68   Pulse 76   Ht 5\' 7"  (1.702 m)   Wt 145 lb 12.8 oz (66.1 kg)   LMP 03/31/2021 (Approximate)   BMI 22.84 kg/m  General: alert, cooperative, appears stated age, and no acute distress    Lab Review Urine HCG: positive    Assessment:    Absence of menstruation.     Plan:    Pregnancy Test:  Positive: EDC: 01/05/2022. Briefly discussed pre-natal care options. Md and midwifery care reviewed. Encouraged well-balanced diet, plenty of rest when needed, pre-natal vitamins daily and walking for exercise. Discussed self-help for nausea, avoiding OTC medications until consulting provider or pharmacist, other than Tylenol as needed, minimal caffeine (1-2 cups daily) and avoiding alcohol. She will schedule dating u/s in the next few weeks, nurse visit in 3 wks,  her initial OB visit in 5 wks. Feel free to call with any questions.   01/07/2022, CNM

## 2021-06-07 ENCOUNTER — Encounter: Payer: Self-pay | Admitting: Certified Nurse Midwife

## 2021-06-08 ENCOUNTER — Other Ambulatory Visit: Payer: Self-pay

## 2021-06-08 ENCOUNTER — Ambulatory Visit (INDEPENDENT_AMBULATORY_CARE_PROVIDER_SITE_OTHER): Payer: Self-pay

## 2021-06-08 ENCOUNTER — Ambulatory Visit (INDEPENDENT_AMBULATORY_CARE_PROVIDER_SITE_OTHER): Payer: Self-pay | Admitting: *Deleted

## 2021-06-08 VITALS — BP 105/65 | HR 101 | Wt 148.0 lb

## 2021-06-08 DIAGNOSIS — Z3402 Encounter for supervision of normal first pregnancy, second trimester: Secondary | ICD-10-CM | POA: Insufficient documentation

## 2021-06-08 DIAGNOSIS — N912 Amenorrhea, unspecified: Secondary | ICD-10-CM

## 2021-06-08 DIAGNOSIS — Z3403 Encounter for supervision of normal first pregnancy, third trimester: Secondary | ICD-10-CM | POA: Insufficient documentation

## 2021-06-08 DIAGNOSIS — Z3A09 9 weeks gestation of pregnancy: Secondary | ICD-10-CM

## 2021-06-08 DIAGNOSIS — Z3401 Encounter for supervision of normal first pregnancy, first trimester: Secondary | ICD-10-CM

## 2021-06-08 NOTE — Progress Notes (Signed)
New OB Intake   I explained I am completing New OB Intake today. We discussed her EDD of 01/05/22 that is based on LMP of 03/31/2021. Pt is G1/P0. I reviewed her allergies, medications, Medical/Surgical/OB history, and appropriate screenings. Based on history, this is a/an  pregnancy uncomplicated .  Viability scan completed today.   There are no problems to display for this patient.    Delivery Plans:  Plans to deliver at [facility].    Anatomy US Explained first scheduled [place] will be around 19 weeks.   Labs Discussed Avelina Laine genetic screening with patient.  Routine prenatal labs needed.    Placed OB Box on problem list and updated  First visit review I reviewed new OB appt with pt. I explained she will have ob bloodwork with genetic screening. Explained pt will be seen by Dr Shawnie Pons at first visit; encounter routed to appropriate provider.   Patient informed that the ultrasound is considered a limited obstetric ultrasound and is not intended to be a complete ultrasound exam.  Patient also informed that the ultrasound is not being completed with the intent of assessing for fetal or placental anomalies or any pelvic abnormalities. Explained that the purpose of today's ultrasound is to assess for fetal heart rate.  Patient acknowledges the purpose of the exam and the limitations of the study.         Scheryl Marten, RN 06/08/2021  2:37 PM

## 2021-06-13 ENCOUNTER — Encounter: Payer: Self-pay | Admitting: Certified Nurse Midwife

## 2021-06-21 ENCOUNTER — Other Ambulatory Visit: Payer: Self-pay

## 2021-06-21 ENCOUNTER — Encounter: Payer: Self-pay | Admitting: Family Medicine

## 2021-06-21 ENCOUNTER — Ambulatory Visit (INDEPENDENT_AMBULATORY_CARE_PROVIDER_SITE_OTHER): Payer: 59 | Admitting: Family Medicine

## 2021-06-21 ENCOUNTER — Other Ambulatory Visit (HOSPITAL_COMMUNITY)
Admission: RE | Admit: 2021-06-21 | Discharge: 2021-06-21 | Disposition: A | Discharge status: Home / Self Care | Payer: 59 | Source: Ambulatory Visit | Attending: Family Medicine | Admitting: Family Medicine

## 2021-06-21 DIAGNOSIS — Z3401 Encounter for supervision of normal first pregnancy, first trimester: Secondary | ICD-10-CM | POA: Insufficient documentation

## 2021-06-21 NOTE — Progress Notes (Signed)
     Subjective:   Jodi Sanders is a 25 y.o. G1P0000 at [redacted]w[redacted]d by LMP, early ultrasound being seen today for her first obstetrical visit.  Her obstetrical history is not significant. Pregnancy history fully reviewed. Had CPE in 03/2021.  Patient reports fatigue and nausea.  HISTORY: OB History  Gravida Para Term Preterm AB Living  1 0 0 0 0 0  SAB IAB Ectopic Multiple Live Births  0 0 0 0 0    # Outcome Date GA Lbr Len/2nd Weight Sex Delivery Anes PTL Lv  1 Current            Last pap smear was  03/21/2021 and was normal Past Medical History:  Diagnosis Date   Medical history non-contributory    No pertinent past medical history    Past Surgical History:  Procedure Laterality Date   TONSILLECTOMY     Family History  Problem Relation Age of Onset   Healthy Mother    Healthy Father    Heart disease Paternal Grandfather    Social History   Tobacco Use   Smoking status: Never   Smokeless tobacco: Never  Vaping Use   Vaping Use: Every day  Substance Use Topics   Alcohol use: Not Currently    Comment: occasionally   Drug use: No   No Known Allergies Current Outpatient Medications on File Prior to Visit  Medication Sig Dispense Refill   Prenatal Vit-Fe Fumarate-FA (MULTIVITAMIN-PRENATAL) 27-0.8 MG TABS tablet Take 1 tablet by mouth daily at 12 noon.     No current facility-administered medications on file prior to visit.     Exam   Vitals:   06/21/21 1409  BP: 107/68  Pulse: 85  Weight: 151 lb (68.5 kg)   Fetal Heart Rate (bpm): 165  System: General: well-developed, well-nourished female in no acute distress   Skin: normal coloration and turgor, no rashes   Neurologic: oriented, normal, negative, normal mood   Extremities: normal strength, tone, and muscle mass, ROM of all joints is normal   HEENT PERRLA, extraocular movement intact and sclera clear, anicteric   Mouth/Teeth mucous membranes moist, pharynx normal without lesions and dental hygiene good    Neck supple and no masses   Cardiovascular: regular rate and rhythm   Respiratory:  no respiratory distress, normal breath sounds   Abdomen: soft, non-tender; bowel sounds normal; no masses,  no organomegaly     Assessment:   Pregnancy: G1P0000 Patient Active Problem List   Diagnosis Date Noted   Encounter for supervision of normal first pregnancy in first trimester 06/08/2021     Plan:  1. Encounter for supervision of normal first pregnancy in first trimester New OB labs - CBC/D/Plt+RPR+Rh+ABO+RubIgG... - Culture, OB Urine - GC/Chlamydia probe amp ([place])not at [facility] - [place] MFM OB COMP + 14 WK; Future - Babyscripts Schedule Optimization   Initial labs drawn. Continue prenatal vitamins. Genetic Screening discussed, NIPS: declined. Ultrasound discussed; fetal anatomic survey: ordered. Problem list reviewed and updated. The nature of [facility] with multiple MDs and other Advanced Practice Providers was explained to patient; also emphasized that residents, students are part of our team. Routine obstetric precautions reviewed. No follow-ups on file.

## 2021-06-23 LAB — CBC/D/PLT+RPR+RH+ABO+RUBIGG...
Antibody Screen: NEGATIVE
Basophils Absolute: 0 10*3/uL (ref 0.0–0.2)
Basos: 0 %
EOS (ABSOLUTE): 0.1 10*3/uL (ref 0.0–0.4)
Eos: 1 %
HCV Ab: <0.1 s/co ratio (ref 0.0–0.9)
HIV Screen 4th Generation wRfx: NONREACTIVE
Hematocrit: 36.7 % (ref 34.0–46.6)
Hemoglobin: 12.3 g/dL (ref 11.1–15.9)
Hepatitis B Surface Ag: NEGATIVE
Immature Grans (Abs): 0 10*3/uL (ref 0.0–0.1)
Immature Granulocytes: 0 %
Lymphocytes Absolute: 2 10*3/uL (ref 0.7–3.1)
Lymphs: 26 %
MCH: 30.4 pg (ref 26.6–33.0)
MCHC: 33.5 g/dL (ref 31.5–35.7)
MCV: 91 fL (ref 79–97)
Monocytes Absolute: 0.4 10*3/uL (ref 0.1–0.9)
Monocytes: 5 %
Neutrophils Absolute: 5.1 10*3/uL (ref 1.4–7.0)
Neutrophils: 68 %
Platelets: 212 10*3/uL (ref 150–450)
RBC: 4.05 x10E6/uL (ref 3.77–5.28)
RDW: 13.5 % (ref 11.7–15.4)
RPR Ser Ql: NONREACTIVE
Rh Factor: POSITIVE
Rubella Antibodies, IGG: 2.1 index (ref >0.99)
WBC: 7.6 10*3/uL (ref 3.4–10.8)

## 2021-06-23 LAB — GC/CHLAMYDIA PROBE AMP (~~LOC~~) NOT AT ARMC
Chlamydia: NEGATIVE
Comment: NEGATIVE
Comment: NORMAL
Neisseria Gonorrhea: NEGATIVE

## 2021-06-23 LAB — URINE CULTURE, OB REFLEX

## 2021-06-23 LAB — CULTURE, OB URINE

## 2021-06-23 LAB — HCV INTERPRETATION

## 2021-06-27 ENCOUNTER — Encounter: Payer: 59 | Admitting: Certified Nurse Midwife

## 2021-07-20 ENCOUNTER — Ambulatory Visit (INDEPENDENT_AMBULATORY_CARE_PROVIDER_SITE_OTHER): Payer: 59 | Admitting: Obstetrics and Gynecology

## 2021-07-20 ENCOUNTER — Encounter: Payer: Self-pay | Admitting: Obstetrics and Gynecology

## 2021-07-20 ENCOUNTER — Other Ambulatory Visit: Payer: Self-pay

## 2021-07-20 VITALS — BP 101/58 | HR 81 | Wt 156.6 lb

## 2021-07-20 DIAGNOSIS — Z3A15 15 weeks gestation of pregnancy: Secondary | ICD-10-CM

## 2021-07-20 DIAGNOSIS — Z3402 Encounter for supervision of normal first pregnancy, second trimester: Secondary | ICD-10-CM

## 2021-07-20 NOTE — Progress Notes (Signed)
   PRENATAL VISIT NOTE  Subjective:  Jodi Sanders is a 25 y.o. G1P0000 at [redacted]w[redacted]d being seen today for ongoing prenatal care.  She is currently monitored for the following issues for this low-risk pregnancy and has Encounter for supervision of normal first pregnancy in second trimester on their problem list.  Patient reports no complaints.  Contractions: Not present. Vag. Bleeding: None.  Movement: Absent. Denies leaking of fluid.   The following portions of the patient's history were reviewed and updated as appropriate: allergies, current medications, past family history, past medical history, past social history, past surgical history and problem list.   Objective:   Vitals:   07/20/21 1341  BP: (!) 101/58  Pulse: 81  Weight: 156 lb 9.6 oz (71 kg)    Fetal Status: Fetal Heart Rate (bpm): 146   Movement: Absent     General:  Alert, oriented and cooperative. Patient is in no acute distress.  Skin: Skin is warm and dry. No rash noted.   Cardiovascular: Normal heart rate noted  Respiratory: Normal respiratory effort, no problems with respiration noted  Abdomen: Soft, gravid, appropriate for gestational age.  Pain/Pressure: Absent     Pelvic: Cervical exam deferred        Extremities: Normal range of motion.  Edema: None  Mental Status: Normal mood and affect. Normal behavior. Normal judgment and thought content.   Assessment and Plan:  Pregnancy: G1P0000 at [redacted]w[redacted]d 1. Encounter for supervision of normal first pregnancy in second trimester Already has anatomy u/s later this month. Declines afp  2. [redacted] weeks gestation of pregnancy  Preterm labor symptoms and general obstetric precautions including but not limited to vaginal bleeding, contractions, leaking of fluid and fetal movement were reviewed in detail with the patient. Please refer to After Visit Summary for other counseling recommendations.   Return in about 1 month (around 08/19/2021) for low risk ob, in person, md or  app.  Future Appointments  Date Time Provider Department Center  08/14/2021  1:00 PM [facility] US1 [facility]  03/26/2022  2:30 PM Doreene Burke, CNM [facility] None    [place], MD

## 2021-08-14 ENCOUNTER — Other Ambulatory Visit: Payer: Self-pay

## 2021-08-14 ENCOUNTER — Ambulatory Visit: Payer: 59 | Attending: Family Medicine

## 2021-08-14 DIAGNOSIS — Z3401 Encounter for supervision of normal first pregnancy, first trimester: Secondary | ICD-10-CM | POA: Diagnosis not present

## 2021-08-15 ENCOUNTER — Other Ambulatory Visit: Payer: Self-pay | Admitting: *Deleted

## 2021-08-15 DIAGNOSIS — Z362 Encounter for other antenatal screening follow-up: Secondary | ICD-10-CM

## 2021-08-24 ENCOUNTER — Other Ambulatory Visit: Payer: Self-pay

## 2021-08-24 ENCOUNTER — Encounter: Payer: Self-pay | Admitting: Family Medicine

## 2021-08-24 ENCOUNTER — Ambulatory Visit (INDEPENDENT_AMBULATORY_CARE_PROVIDER_SITE_OTHER): Payer: 59 | Admitting: Family Medicine

## 2021-08-24 DIAGNOSIS — Z3402 Encounter for supervision of normal first pregnancy, second trimester: Secondary | ICD-10-CM

## 2021-08-24 NOTE — Patient Instructions (Signed)

## 2021-08-25 NOTE — Progress Notes (Signed)
   PRENATAL VISIT NOTE  Subjective:  Jodi Sanders is a 25 y.o. G1P0000 at [redacted]w[redacted]d being seen today for ongoing prenatal care.  She is currently monitored for the following issues for this low-risk pregnancy and has Encounter for supervision of normal first pregnancy in second trimester on their problem list.  Patient reports no complaints.  Contractions: Not present. Vag. Bleeding: None.  Movement: Present. Denies leaking of fluid.   The following portions of the patient's history were reviewed and updated as appropriate: allergies, current medications, past family history, past medical history, past social history, past surgical history and problem list.   Objective:   Vitals:   08/24/21 1342  BP: 105/66  Pulse: 89  Weight: 163 lb (73.9 kg)    Fetal Status: Fetal Heart Rate (bpm): 142   Movement: Present     General:  Alert, oriented and cooperative. Patient is in no acute distress.  Skin: Skin is warm and dry. No rash noted.   Cardiovascular: Normal heart rate noted  Respiratory: Normal respiratory effort, no problems with respiration noted  Abdomen: Soft, gravid, appropriate for gestational age.  Pain/Pressure: Absent     Pelvic: Cervical exam deferred        Extremities: Normal range of motion.  Edema: None  Mental Status: Normal mood and affect. Normal behavior. Normal judgment and thought content.   Assessment and Plan:  Pregnancy: G1P0000 at [redacted]w[redacted]d 1. Encounter for supervision of normal first pregnancy in second trimester Continue routine prenatal care. Has f/u anatomy US scheduled  General obstetric precautions including but not limited to vaginal bleeding, contractions, leaking of fluid and fetal movement were reviewed in detail with the patient. Please refer to After Visit Summary for other counseling recommendations.   Return in 4 weeks (on 09/21/2021).  Future Appointments  Date Time Provider Department Center  09/11/2021  3:00 PM [facility] NURSE [facility]   09/11/2021  3:15 PM [facility] US2 [facility]  09/21/2021  8:15 AM Anyanwu, Jethro Bastos, MD [facility]  10/17/2021  8:15 AM Anyanwu, Jethro Bastos, MD [facility]  03/26/2022  2:30 PM Doreene Burke, CNM [facility] None    Reva Bores, MD

## 2021-08-29 ENCOUNTER — Inpatient Hospital Stay (HOSPITAL_COMMUNITY)
Admission: AD | Admit: 2021-08-29 | Discharge: 2021-08-29 | Disposition: A | Discharge status: Home / Self Care | Payer: 59 | Attending: Obstetrics and Gynecology | Admitting: Obstetrics and Gynecology

## 2021-08-29 ENCOUNTER — Encounter (HOSPITAL_COMMUNITY): Payer: Self-pay | Admitting: Obstetrics and Gynecology

## 2021-08-29 ENCOUNTER — Other Ambulatory Visit: Payer: Self-pay

## 2021-08-29 DIAGNOSIS — N949 Unspecified condition associated with female genital organs and menstrual cycle: Secondary | ICD-10-CM

## 2021-08-29 DIAGNOSIS — Z3A21 21 weeks gestation of pregnancy: Secondary | ICD-10-CM | POA: Diagnosis not present

## 2021-08-29 DIAGNOSIS — R109 Unspecified abdominal pain: Secondary | ICD-10-CM | POA: Diagnosis not present

## 2021-08-29 DIAGNOSIS — R1031 Right lower quadrant pain: Secondary | ICD-10-CM | POA: Diagnosis present

## 2021-08-29 DIAGNOSIS — Z3402 Encounter for supervision of normal first pregnancy, second trimester: Secondary | ICD-10-CM

## 2021-08-29 DIAGNOSIS — O26892 Other specified pregnancy related conditions, second trimester: Secondary | ICD-10-CM

## 2021-08-29 DIAGNOSIS — R102 Pelvic and perineal pain: Secondary | ICD-10-CM | POA: Insufficient documentation

## 2021-08-29 LAB — URINALYSIS, ROUTINE W REFLEX MICROSCOPIC
Bilirubin Urine: NEGATIVE
Glucose, UA: NEGATIVE mg/dL
Hgb urine dipstick: NEGATIVE
Ketones, ur: 20 mg/dL — AB
Leukocytes,Ua: NEGATIVE
Nitrite: NEGATIVE
Protein, ur: NEGATIVE mg/dL
Specific Gravity, Urine: 1.01 (ref 1.005–1.030)
pH: 7 (ref 5.0–8.0)

## 2021-08-29 LAB — CBC
HCT: 32.7 % — ABNORMAL LOW (ref 36.0–46.0)
Hemoglobin: 11.3 g/dL — ABNORMAL LOW (ref 12.0–15.0)
MCH: 32.5 pg (ref 26.0–34.0)
MCHC: 34.6 g/dL (ref 30.0–36.0)
MCV: 94 fL (ref 80.0–100.0)
Platelets: 170 10*3/uL (ref 150–400)
RBC: 3.48 MIL/uL — ABNORMAL LOW (ref 3.87–5.11)
RDW: 13.3 % (ref 11.5–15.5)
WBC: 8.2 10*3/uL (ref 4.0–10.5)
nRBC: 0 % (ref 0.0–0.2)

## 2021-08-29 LAB — WET PREP, GENITAL
Clue Cells Wet Prep HPF POC: NONE SEEN
Sperm: NONE SEEN
Trich, Wet Prep: NONE SEEN
Yeast Wet Prep HPF POC: NONE SEEN

## 2021-08-29 MED ORDER — ACETAMINOPHEN 500 MG PO TABS: 1000.0000 mg | ORAL_TABLET | Freq: Four times a day (QID) | ORAL | 0 refills | Status: DC | PRN

## 2021-08-29 MED ORDER — ACETAMINOPHEN 500 MG PO TABS
1000.0000 mg | ORAL_TABLET | Freq: Four times a day (QID) | ORAL | Status: DC | PRN
  Administered 2021-08-29: 1000 mg via ORAL
  Filled 2021-08-29: qty 2

## 2021-08-29 NOTE — MAU Note (Addendum)
Having lower abd cramping RLQ to lower mid abdomen for an hour. Varies with intensity but constant. No VB or d/c. Pain is worse with movement. Reports normal BMs and no dysuria. Denies n/v/d

## 2021-08-29 NOTE — MAU Note (Signed)
Received 25 y/o , G1p0, 21 2/7 weeks, pt c/o lower abdominal pain x 2 hrs, intermittent at time, rate pain 8/10 scale, denies any vaginal bleeding or leakage of fluid, support person at bedside, safety maintained.

## 2021-08-29 NOTE — Progress Notes (Signed)
Written and verbal d/c instructions given and understanding voiced. 

## 2021-08-29 NOTE — MAU Provider Note (Signed)
History     CSN: [id number]  Arrival date and time: 08/29/21 1941   Event Date/Time   First Provider Initiated Contact with Patient 08/29/21 2044      Chief Complaint  Patient presents with   Abdominal Pain   25 y.o. G1 @21 .4 wks presenting with LAP. Pain started 2 hrs ago while she was sitting. Pain is constant but worsens intermittently. Pain is worse on RLQ. Denies VB or LOF. No urinary sx. Had BM yesterday. She is eating and drinking well.   OB History     Gravida  1   Para  0   Term  0   Preterm  0   AB  0   Living  0      SAB  0   IAB  0   Ectopic  0   Multiple  0   Live Births  0           Past Medical History:  Diagnosis Date   Medical history non-contributory    No pertinent past medical history     Past Surgical History:  Procedure Laterality Date   TONSILLECTOMY      Family History  Problem Relation Age of Onset   Healthy Mother    Healthy Father    Heart disease Paternal Grandfather     Social History   Tobacco Use   Smoking status: Never   Smokeless tobacco: Never  Vaping Use   Vaping Use: Every day  Substance Use Topics   Alcohol use: Not Currently    Comment: occasionally   Drug use: No    Allergies: No Known Allergies  Medications Prior to Admission  Medication Sig Dispense Refill Last Dose   Prenatal Vit-Fe Fumarate-FA (MULTIVITAMIN-PRENATAL) 27-0.8 MG TABS tablet Take 1 tablet by mouth daily at 12 noon.   08/28/2021    Review of Systems  Constitutional:  Negative for chills and fever.  Gastrointestinal:  Positive for abdominal pain. Negative for constipation, diarrhea, nausea and vomiting.  Genitourinary:  Negative for dysuria, frequency, urgency, vaginal bleeding and vaginal discharge.  Musculoskeletal:  Negative for back pain.  Physical Exam   Blood pressure (!) 112/53, pulse 87, temperature 98.3 F (36.8 C), resp. rate 16, height 5\' 7"  (1.702 m), weight 73.9 kg, last menstrual period 03/31/2021, SpO2  99 %.  Physical Exam Vitals and nursing note reviewed. Exam conducted with a chaperone present.  Constitutional:      General: She is not in acute distress.    Appearance: Normal appearance.  HENT:     Head: Normocephalic and atraumatic.  Cardiovascular:     Rate and Rhythm: Normal rate.  Pulmonary:     Effort: Pulmonary effort is normal. No respiratory distress.  Abdominal:     General: There is no distension.     Palpations: Abdomen is soft. There is no mass.     Tenderness: There is abdominal tenderness in the left lower quadrant. There is no guarding or rebound.     Hernia: No hernia is present.     Comments: gravid  Genitourinary:    Comments: VE: closed/long Musculoskeletal:        General: Normal range of motion.     Cervical back: Normal range of motion.  Skin:    General: Skin is warm and dry.  Neurological:     General: No focal deficit present.     Mental Status: She is alert and oriented to person, place, and time.  Psychiatric:  Mood and Affect: Mood normal.        Behavior: Behavior normal.  FHT 140  Results for orders placed or performed during the hospital encounter of 08/29/21 (from the past 24 hour(s))  Urinalysis, Routine w reflex microscopic Urine, Clean Catch     Status: Abnormal   Collection Time: 08/29/21  8:05 PM  Result Value Ref Range   Color, Urine YELLOW YELLOW   APPearance HAZY (A) CLEAR   Specific Gravity, Urine 1.010 1.005 - 1.030   pH 7.0 5.0 - 8.0   Glucose, UA NEGATIVE NEGATIVE mg/dL   Hgb urine dipstick NEGATIVE NEGATIVE   Bilirubin Urine NEGATIVE NEGATIVE   Ketones, ur 20 (A) NEGATIVE mg/dL   Protein, ur NEGATIVE NEGATIVE mg/dL   Nitrite NEGATIVE NEGATIVE   Leukocytes,Ua NEGATIVE NEGATIVE  Wet prep, genital     Status: Abnormal   Collection Time: 08/29/21  9:00 PM   Specimen: Vaginal  Result Value Ref Range   Yeast Wet Prep HPF POC NONE SEEN NONE SEEN   Trich, Wet Prep NONE SEEN NONE SEEN   Clue Cells Wet Prep HPF POC  NONE SEEN NONE SEEN   WBC, Wet Prep HPF POC FEW (A) NONE SEEN   Sperm NONE SEEN   CBC     Status: Abnormal   Collection Time: 08/29/21  9:10 PM  Result Value Ref Range   WBC 8.2 4.0 - 10.5 K/uL   RBC 3.48 (L) 3.87 - 5.11 MIL/uL   Hemoglobin 11.3 (L) 12.0 - 15.0 g/dL   HCT 17.4 (L) 94.4 - 96.7 %   MCV 94.0 80.0 - 100.0 fL   MCH 32.5 26.0 - 34.0 pg   MCHC 34.6 30.0 - 36.0 g/dL   RDW 59.1 63.8 - 46.6 %   Platelets 170 150 - 400 K/uL   nRBC 0.0 0.0 - 0.2 %   MAU Course  Procedures Tylenol  MDM Labs ordered and reviewed. Feeling better prior to med. No signs of acute process or PTL. Stable for discharge home.   Assessment and Plan   1. Encounter for supervision of normal first pregnancy in second trimester   2. [redacted] weeks gestation of pregnancy   3. Round ligament pain    Discharge home Follow up at [facility] as scheduled PTL precautions  Allergies as of 08/29/2021   No Known Allergies      Medication List     TAKE these medications    acetaminophen 500 MG tablet Commonly known as: TYLENOL Take 2 tablets (1,000 mg total) by mouth every 6 (six) hours as needed for moderate pain.   multivitamin-prenatal 27-0.8 MG Tabs tablet Take 1 tablet by mouth daily at 12 noon.        Donette Larry, CNM 08/29/2021, 10:01 PM

## 2021-08-30 LAB — GC/CHLAMYDIA PROBE AMP (~~LOC~~) NOT AT ARMC
Chlamydia: NEGATIVE
Comment: NEGATIVE
Comment: NORMAL
Neisseria Gonorrhea: NEGATIVE

## 2021-09-11 ENCOUNTER — Ambulatory Visit: Payer: 59 | Attending: Obstetrics

## 2021-09-11 ENCOUNTER — Ambulatory Visit: Payer: 59 | Admitting: *Deleted

## 2021-09-11 ENCOUNTER — Other Ambulatory Visit: Payer: Self-pay

## 2021-09-11 VITALS — BP 104/51 | HR 76

## 2021-09-11 DIAGNOSIS — Z3402 Encounter for supervision of normal first pregnancy, second trimester: Secondary | ICD-10-CM

## 2021-09-11 DIAGNOSIS — Z3A23 23 weeks gestation of pregnancy: Secondary | ICD-10-CM | POA: Diagnosis not present

## 2021-09-11 DIAGNOSIS — Z362 Encounter for other antenatal screening follow-up: Secondary | ICD-10-CM | POA: Diagnosis not present

## 2021-09-21 ENCOUNTER — Telehealth (INDEPENDENT_AMBULATORY_CARE_PROVIDER_SITE_OTHER): Payer: 59 | Admitting: Obstetrics & Gynecology

## 2021-09-21 ENCOUNTER — Telehealth: Payer: 59 | Admitting: Obstetrics & Gynecology

## 2021-09-21 ENCOUNTER — Encounter: Payer: Self-pay | Admitting: Obstetrics & Gynecology

## 2021-09-21 DIAGNOSIS — Z3A24 24 weeks gestation of pregnancy: Secondary | ICD-10-CM

## 2021-09-21 DIAGNOSIS — Z3402 Encounter for supervision of normal first pregnancy, second trimester: Secondary | ICD-10-CM

## 2021-09-21 NOTE — Progress Notes (Signed)
I connected with  Jodi Sanders on 09/21/21 at  9:15 AM EDT by telephone and verified that I am speaking with the correct person using two identifiers.   I discussed the limitations, risks, security and privacy concerns of performing an evaluation and management service by telephone and the availability of in person appointments. I also discussed with the patient that there may be a patient responsible charge related to this service. The patient expressed understanding and agreed to proceed.  Scheryl Marten, RN 09/21/2021  9:15 AM

## 2021-09-21 NOTE — Progress Notes (Signed)
OBSTETRICS PRENATAL VIRTUAL VISIT ENCOUNTER NOTE  Provider location: Center for [facility]   Patient location: Work  I connected with Leane Call on 09/21/21 at  9:15 AM EDT by MyChart Video Encounter and verified that I am speaking with the correct person using two identifiers. I discussed the limitations, risks, security and privacy concerns of performing an evaluation and management service virtually and the availability of in person appointments. I also discussed with the patient that there may be a patient responsible charge related to this service. The patient expressed understanding and agreed to proceed. Subjective:  Jodi Sanders is a 25 y.o. G1P0000 at [redacted]w[redacted]d being seen today for ongoing prenatal care.  She is currently monitored for the following issues for this low-risk pregnancy and has Encounter for supervision of normal first pregnancy in second trimester on their problem list.  Patient reports no complaints.  Contractions: Not present. Vag. Bleeding: None.  Movement: Present. Denies any leaking of fluid.   The following portions of the patient's history were reviewed and updated as appropriate: allergies, current medications, past family history, past medical history, past social history, past surgical history and problem list.   Objective:  There were no vitals filed for this visit.  Fetal Status:     Movement: Present     General:  Alert, oriented and cooperative. Patient is in no acute distress.  Respiratory: Normal respiratory effort, no problems with respiration noted  Mental Status: Normal mood and affect. Normal behavior. Normal judgment and thought content.  Rest of physical exam deferred due to type of encounter  Imaging: [place] MFM OB FOLLOW UP  Result Date: 09/11/2021 ----------------------------------------------------------------------  OBSTETRICS REPORT                       (Signed Final 09/11/2021 04:19 pm)  ---------------------------------------------------------------------- Patient Info  ID #:       [id number]                          D.O.B.:  January 03, 1996 (25 yrs)  Name:       Jodi Sanders [facility]                Visit Date: 09/11/2021 04:04 pm ---------------------------------------------------------------------- Performed By  Attending:        Noralee Space MD        Ref. Address:     [address]                                                             [address]  Performed By:     Emeline Darling BS,      [facility]  Referred By:      [facility] Mila Merry ---------------------------------------------------------------------- Orders  #  Description                           Code        Ordered By  1  [place] MFM OB FOLLOW UP                   [phone]    Rosana Hoes ----------------------------------------------------------------------  #  Order #                     Accession #                Episode #  1  [id number]                   [id number]                 [id number] ---------------------------------------------------------------------- Indications  [redacted] weeks gestation of pregnancy                Z3A.23  NIPS declined  Antenatal follow-up for nonvisualized fetal    Z36.2  anatomy ---------------------------------------------------------------------- Fetal Evaluation  Num Of Fetuses:         1  Fetal Heart Rate(bpm):  155  Cardiac Activity:       Observed  Presentation:           Cephalic  Placenta:               Posterior Fundal  P. Cord Insertion:      Previously Visualized  Amniotic Fluid  AFI FV:      Within normal limits                              Largest Pocket(cm)                              5.6 ---------------------------------------------------------------------- Biometry  BPD:         59  mm     G. Age:  24w 1d         70  %    CI:        79.46   %    70 - 86                                                          FL/HC:      19.7   %    19.2 - 20.8  HC:      209.2  mm     G. Age:  23w 0d         19  %    HC/AC:      1.07        1.05 - 1.21  AC:      196.4  mm     G. Age:  24w 2d         70  %    FL/BPD:     69.8   %    71 - 87  FL:       41.2  mm     G. Age:  23w 3d         36  %    FL/AC:      21.0   %    20 - 24  Est. FW:     633  gm      1 lb 6 oz     62  % ---------------------------------------------------------------------- OB History  Gravidity:    1         Term:   0        Prem:   0        SAB:   0  TOP:          0       Ectopic:  0        Living: 0 ---------------------------------------------------------------------- Gestational Age  LMP:           23w 3d        Date:  03/31/21                 EDD:   01/05/22  U/S Today:     23w 5d                                        EDD:   01/03/22  Best:          23w 3d     Det. By:  LMP  (03/31/21)          EDD:   01/05/22 ---------------------------------------------------------------------- Anatomy  Cranium:               Appears normal         Aortic Arch:            Previously seen  Cavum:                 Appears normal         Ductal Arch:            Previously seen  Ventricles:            Appears normal         Diaphragm:              Appears normal  Choroid Plexus:        Previously seen        Stomach:                Appears normal, left                                                                        sided  Cerebellum:            Appears normal         Abdomen:  Appears normal  Posterior Fossa:       Previously seen        Abdominal Wall:         Previously seen  Nuchal Fold:           Previously seen        Cord Vessels:           Appears normal (3                                                                        vessel cord)  Face:                  Orbits and profile     Kidneys:                Appear normal                          previously seen  Lips:                  Appears normal         Bladder:                Appears normal  Thoracic:              Appears normal         Spine:                  Previously seen  Heart:                 Appears normal         Upper Extremities:      Previously seen                         (4CH, axis, and                         situs)  RVOT:                  Appears normal         Lower Extremities:      Previously seen  LVOT:                  Appears normal  Other:  Nasal bone and lenses previously visualized. VC, 3VV and 3VTV          visualized. Heels/feet and open hands/5th digits previously visualized. ---------------------------------------------------------------------- Cervix Uterus Adnexa  Cervix  Length:           3.86  cm.  Normal appearance by transabdominal scan. ---------------------------------------------------------------------- Impression  Patient returned for completion of fetal anatomy .Amniotic  fluid is normal and good fetal activity is seen .Fetal biometry  is consistent with her previously-established dates .Fetal  anatomical survey was completed and appears normal. ---------------------------------------------------------------------- Recommendations  Follow-up scans as clinically indicated. ----------------------------------------------------------------------                  Noralee Space, MD Electronically Signed Final Report   09/11/2021 04:19 pm ----------------------------------------------------------------------   Assessment and Plan:  Pregnancy: G1P0000 at [redacted]w[redacted]d 1. Encounter  for supervision of normal first pregnancy in second trimester 2. [redacted] weeks gestation of pregnancy No issues currently. Told about importance of being fasting for next visit. Preterm labor symptoms and general obstetric precautions including but not limited to vaginal bleeding, contractions, leaking of fluid and fetal movement were reviewed in detail with the patient. I  discussed the assessment and treatment plan with the patient. The patient was provided an opportunity to ask questions and all were answered. The patient agreed with the plan and demonstrated an understanding of the instructions. The patient was advised to call back or seek an in-person office evaluation/go to MAU at [facility] for any urgent or concerning symptoms. Please refer to After Visit Summary for other counseling recommendations.   I provided 7 minutes of face-to-face time during this encounter.  Return in about 26 days (around 10/17/2021) for 2 hr GTT, 3rd trimester labs, TDap, OFFICE OB VISIT (MD or APP).  Future Appointments  Date Time Provider Department Center  10/17/2021  8:15 AM Samiyah Stupka, Jethro Bastos, MD [facility]  03/26/2022  2:30 PM Doreene Burke, CNM [facility] None    Jaynie Collins, MD Center for [facility], [facility]

## 2021-09-21 NOTE — Patient Instructions (Signed)
Return to office for any scheduled appointments. Call the office or go to the [facility] [place] if:  You begin to have strong, frequent contractions  Your water breaks.  Sometimes it is a big gush of fluid, sometimes it is just a trickle that keeps getting your panties wet or running down your legs  You have vaginal bleeding.  It is normal to have a small amount of spotting if your cervix was checked.   You do not feel your baby moving like normal.  If you do not, get something to eat and drink and lay down and focus on feeling your baby move.   If your baby is still not moving like normal, you should call the office or go to MAU.  Any other obstetric concerns.  TDaP Vaccine Pregnancy Get the Whooping Cough Vaccine While You Are Pregnant (CDC)  It is important for women to get the whooping cough vaccine in the third trimester of each pregnancy. Vaccines are the best way to prevent this disease. There are 2 different whooping cough vaccines. Both vaccines combine protection against whooping cough, tetanus and diphtheria, but they are for different age groups: Tdap: for everyone 11 years or older, including pregnant women  DTaP: for children 2 months through 6 years of age  You need the whooping cough vaccine during each of your pregnancies The recommended time to get the shot is during your 27th through 36th week of pregnancy, preferably during the earlier part of this time period. The Centers for Disease Control and Prevention (CDC) recommends that pregnant women receive the whooping cough vaccine for adolescents and adults (called Tdap vaccine) during the third trimester of each pregnancy. The recommended time to get the shot is during your 27th through 36th week of pregnancy, preferably during the earlier part of this time period. This replaces the original recommendation that pregnant women get the vaccine only if they had not previously received it. The  American College of Obstetricians and Gynecologists and the American College of Nurse-Midwives support this recommendation.  You should get the whooping cough vaccine while pregnant to pass protection to your baby frame support disabled and/or not supported in this browser  Learn why Laura decided to get the whooping cough vaccine in her 3rd trimester of pregnancy and how her baby girl was born with some protection against the disease. Also available on YouTube. After receiving the whooping cough vaccine, your body will create protective antibodies (proteins produced by the body to fight off diseases) and pass some of them to your baby before birth. These antibodies provide your baby some short-term protection against whooping cough in early life. These antibodies can also protect your baby from some of the more serious complications that come along with whooping cough. Your protective antibodies are at their highest about 2 weeks after getting the vaccine, but it takes time to pass them to your baby. So the preferred time to get the whooping cough vaccine is early in your third trimester. The amount of whooping cough antibodies in your body decreases over time. That is why CDC recommends you get a whooping cough vaccine during each pregnancy. Doing so allows each of your babies to get the greatest number of protective antibodies from you. This means each of your babies will get the best protection possible against this disease.  Getting the whooping cough vaccine while pregnant is better than getting the vaccine after you give birth Whooping cough vaccination during   pregnancy is ideal so your baby will have short-term protection as soon as he is born. This early protection is important because your baby will not start getting his whooping cough vaccines until he is 2 months old. These first few months of life are when your baby is at greatest risk for catching whooping cough. This is also when he's at  greatest risk for having severe, potentially life-threating complications from the infection. To avoid that gap in protection, it is best to get a whooping cough vaccine during pregnancy. You will then pass protection to your baby before he is born. To continue protecting your baby, he should get whooping cough vaccines starting at 2 months old. You may never have gotten the Tdap vaccine before and did not get it during this pregnancy. If so, you should make sure to get the vaccine immediately after you give birth, before leaving the hospital or birthing center. It will take about 2 weeks before your body develops protection (antibodies) in response to the vaccine. Once you have protection from the vaccine, you are less likely to give whooping cough to your newborn while caring for him. But remember, your baby will still be at risk for catching whooping cough from others. A recent study looked to see how effective Tdap was at preventing whooping cough in babies whose mothers got the vaccine while pregnant or in the hospital after giving birth. The study found that getting Tdap between 27 through 36 weeks of pregnancy is 85% more effective at preventing whooping cough in babies younger than 2 months old. Blood tests cannot tell if you need a whooping cough vaccine There are no blood tests that can tell you if you have enough antibodies in your body to protect yourself or your baby against whooping cough. Even if you have been sick with whooping cough in the past or previously received the vaccine, you still should get the vaccine during each pregnancy. Breastfeeding may pass some protective antibodies onto your baby By breastfeeding, you may pass some antibodies you have made in response to the vaccine to your baby. When you get a whooping cough vaccine during your pregnancy, you will have antibodies in your breast milk that you can share with your baby as soon as your milk comes in. However, your baby will not  get protective antibodies immediately if you wait to get the whooping cough vaccine until after delivering your baby. This is because it takes about 2 weeks for your body to create antibodies. Learn more about the health benefits of breastfeeding.  

## 2021-10-16 ENCOUNTER — Telehealth: Payer: Self-pay

## 2021-10-16 NOTE — Telephone Encounter (Signed)
Breast pump order signed and faxed to [employer].  Confirmation received.

## 2021-10-17 ENCOUNTER — Other Ambulatory Visit: Payer: Self-pay

## 2021-10-17 ENCOUNTER — Ambulatory Visit (INDEPENDENT_AMBULATORY_CARE_PROVIDER_SITE_OTHER): Payer: 59 | Admitting: Obstetrics & Gynecology

## 2021-10-17 ENCOUNTER — Encounter: Payer: Self-pay | Admitting: Obstetrics & Gynecology

## 2021-10-17 VITALS — BP 111/74 | HR 96 | Wt 174.0 lb

## 2021-10-17 DIAGNOSIS — Z3403 Encounter for supervision of normal first pregnancy, third trimester: Secondary | ICD-10-CM

## 2021-10-17 DIAGNOSIS — Z3A28 28 weeks gestation of pregnancy: Secondary | ICD-10-CM

## 2021-10-17 LAB — CBC
Hematocrit: 34.8 % (ref 34.0–46.6)
Hemoglobin: 11.7 g/dL (ref 11.1–15.9)
MCH: 31.9 pg (ref 26.6–33.0)
MCHC: 33.6 g/dL (ref 31.5–35.7)
MCV: 95 fL (ref 79–97)
Platelets: 175 10*3/uL (ref 150–450)
RBC: 3.67 x10E6/uL — ABNORMAL LOW (ref 3.77–5.28)
RDW: 12.7 % (ref 11.7–15.4)
WBC: 6.1 10*3/uL (ref 3.4–10.8)

## 2021-10-17 NOTE — Patient Instructions (Signed)
Return to office for any scheduled appointments. Call the office or go to the [facility] at [facility] [place] if:  You begin to have strong, frequent contractions  Your water breaks.  Sometimes it is a big gush of fluid, sometimes it is just a trickle that keeps getting your panties wet or running down your legs  You have vaginal bleeding.  It is normal to have a small amount of spotting if your cervix was checked.   You do not feel your baby moving like normal.  If you do not, get something to eat and drink and lay down and focus on feeling your baby move.   If your baby is still not moving like normal, you should call the office or go to MAU.  Any other obstetric concerns.   

## 2021-10-17 NOTE — Progress Notes (Signed)
   PRENATAL VISIT NOTE  Subjective:  Jodi Sanders is a 25 y.o. G1P0000 at [redacted]w[redacted]d being seen today for ongoing prenatal care.  She is currently monitored for the following issues for this low-risk pregnancy and has Encounter for supervision of normal first pregnancy in third trimester on their problem list.  Patient reports no complaints.  Contractions: Not present. Vag. Bleeding: None.  Movement: Present. Denies leaking of fluid.   The following portions of the patient's history were reviewed and updated as appropriate: allergies, current medications, past family history, past medical history, past social history, past surgical history and problem list.   Objective:   Vitals:   10/17/21 0829  BP: 111/74  Pulse: 96  Weight: 174 lb (78.9 kg)    Fetal Status: Fetal Heart Rate (bpm): 154 Fundal Height: 28 cm Movement: Present     General:  Alert, oriented and cooperative. Patient is in no acute distress.  Skin: Skin is warm and dry. No rash noted.   Cardiovascular: Normal heart rate noted  Respiratory: Normal respiratory effort, no problems with respiration noted  Abdomen: Soft, gravid, appropriate for gestational age.  Pain/Pressure: Absent     Pelvic: Cervical exam deferred        Extremities: Normal range of motion.  Edema: None  Mental Status: Normal mood and affect. Normal behavior. Normal judgment and thought content.   Assessment and Plan:  Pregnancy: G1P0000 at [redacted]w[redacted]d 1. [redacted] weeks gestation of pregnancy 2. Encounter for supervision of normal first pregnancy in third trimester Third trimester labs done today. Declined tDap.  - Glucose Tolerance, 2 Hours w/1 Hour - CBC - HIV Antibody (routine testing w rflx) - RPR Preterm labor symptoms and general obstetric precautions including but not limited to vaginal bleeding, contractions, leaking of fluid and fetal movement were reviewed in detail with the patient. Please refer to After Visit Summary for other counseling  recommendations.   Return in about 2 weeks (around 10/31/2021) for OFFICE OB VISIT (MD or APP).  Future Appointments  Date Time Provider Department Center  03/26/2022  2:30 PM Doreene Burke, CNM [facility] None    Jaynie Collins, MD

## 2021-10-18 LAB — GLUCOSE TOLERANCE, 2 HOURS W/ 1HR
Glucose, 1 hour: 121 mg/dL (ref 70–179)
Glucose, 2 hour: 86 mg/dL (ref 70–152)
Glucose, Fasting: 80 mg/dL (ref 70–91)

## 2021-10-18 LAB — HIV ANTIBODY (ROUTINE TESTING W REFLEX): HIV Screen 4th Generation wRfx: NONREACTIVE

## 2021-10-18 LAB — RPR: RPR Ser Ql: NONREACTIVE

## 2021-10-31 ENCOUNTER — Other Ambulatory Visit: Payer: Self-pay

## 2021-10-31 ENCOUNTER — Ambulatory Visit (INDEPENDENT_AMBULATORY_CARE_PROVIDER_SITE_OTHER): Payer: 59 | Admitting: Obstetrics and Gynecology

## 2021-10-31 VITALS — BP 109/63 | HR 114 | Wt 174.0 lb

## 2021-10-31 DIAGNOSIS — Z3A30 30 weeks gestation of pregnancy: Secondary | ICD-10-CM

## 2021-10-31 NOTE — Progress Notes (Signed)
   PRENATAL VISIT NOTE  Subjective:  Jodi Sanders is a 25 y.o. G1 at [redacted]w[redacted]d being seen today for ongoing prenatal care.  She is currently monitored for the following issues for this low-risk pregnancy and has Encounter for supervision of normal first pregnancy in third trimester on their problem list.  Patient reports no complaints.  Contractions: Not present. Vag. Bleeding: None.  Movement: Present. Denies leaking of fluid.   The following portions of the patient's history were reviewed and updated as appropriate: allergies, current medications, past family history, past medical history, past social history, past surgical history and problem list.   Objective:   Vitals:   10/31/21 0820  BP: 109/63  Pulse: (!) 114  Weight: 174 lb (78.9 kg)    Fetal Status: Fetal Heart Rate (bpm): 156 Fundal Height: 30 cm Movement: Present     General:  Alert, oriented and cooperative. Patient is in no acute distress.  Skin: Skin is warm and dry. No rash noted.   Cardiovascular: Normal heart rate noted  Respiratory: Normal respiratory effort, no problems with respiration noted  Abdomen: Soft, gravid, appropriate for gestational age.  Pain/Pressure: Absent     Pelvic: Cervical exam deferred        Extremities: Normal range of motion.  Edema: None  Mental Status: Normal mood and affect. Normal behavior. Normal judgment and thought content.   Assessment and Plan:  Pregnancy: G1P0000 at [redacted]w[redacted]d Routine care. 28wk labs  Preterm labor symptoms and general obstetric precautions including but not limited to vaginal bleeding, contractions, leaking of fluid and fetal movement were reviewed in detail with the patient. Please refer to After Visit Summary for other counseling recommendations.   Return in about 2 weeks (around 11/14/2021) for low risk ob, in person or virtual, md or app.  Future Appointments  Date Time Provider Department Center  11/14/2021  4:10 PM [place] Bing, MD [facility]  [facility]  12/05/2021  4:10 PM Anyanwu, Jethro Bastos, MD [facility]  12/12/2021  4:10 PM Anyanwu, Jethro Bastos, MD [facility]  12/19/2021  4:10 PM Anyanwu, Jethro Bastos, MD [facility]  12/26/2021  4:10 PM Melvin Bing, MD [facility]  03/26/2022  2:30 PM Doreene Burke, CNM [facility] None    Staley Bing, MD

## 2021-11-14 ENCOUNTER — Ambulatory Visit (INDEPENDENT_AMBULATORY_CARE_PROVIDER_SITE_OTHER): Payer: 59 | Admitting: Obstetrics and Gynecology

## 2021-11-14 ENCOUNTER — Other Ambulatory Visit: Payer: Self-pay

## 2021-11-14 ENCOUNTER — Encounter: Payer: Self-pay | Admitting: Obstetrics and Gynecology

## 2021-11-14 VITALS — BP 129/77 | HR 105 | Wt 179.4 lb

## 2021-11-14 DIAGNOSIS — Z3A32 32 weeks gestation of pregnancy: Secondary | ICD-10-CM

## 2021-11-14 DIAGNOSIS — Z3403 Encounter for supervision of normal first pregnancy, third trimester: Secondary | ICD-10-CM

## 2021-11-19 NOTE — L&D Delivery Note (Signed)
OB/GYN Faculty Practice Delivery Note  Jodi Sanders is a 26 y.o. G1P0000 s/p SVD at [redacted]w[redacted]d. She was admitted for IOL d/t postdates.   ROM: 6h 1m with  fluid GBS Status: Negative Maximum Maternal Temperature: 98.1  Labor Progress: IOL with buccal cytotec and foley balloon placement Pitocin started and uptitrated SROM at 0453  Delivery Date/Time: 1133 Delivery: Called to room and patient was complete and pushing. Head delivered OA. No nuchal cord present. Shoulder and body delivered in usual fashion. Infant with spontaneous cry, placed on mother's abdomen, dried and stimulated. Cord clamped x 2 after 1-minute delay, and cut by father of baby under my direct supervision. Cord blood drawn. Placenta delivered spontaneously with gentle cord traction. Fundus firm with massage and Pitocin. Labia, perineum, vagina, and cervix were inspected, 2nd degree perineal laceration present and repaired with 3-0 vicryl.   Placenta: complete, three vessel cord appreciated Complications: None Lacerations: 2nd degree perineal EBL: 200 mL Analgesia: Epidural  Postpartum Planning [ ]  message to sent to schedule follow-up  [ ]  vaccines UTD  Infant: female   APGARs 9,9   weight pending  , MD [facility] PGY-1 Center for [facility], [facility]

## 2021-11-21 NOTE — Progress Notes (Signed)
° °  PRENATAL VISIT NOTE  Subjective:  Jodi Sanders is a 26 y.o. G1P0000 at [redacted]w[redacted]d being seen today for ongoing prenatal care.  She is currently monitored for the following issues for this low-risk pregnancy and has Encounter for supervision of normal first pregnancy in third trimester on their problem list.  Patient reports no complaints.  Contractions: Not present. Vag. Bleeding: None.  Movement: Present. Denies leaking of fluid.   The following portions of the patient's history were reviewed and updated as appropriate: allergies, current medications, past family history, past medical history, past social history, past surgical history and problem list.   Objective:   Vitals:   11/14/21 1604  BP: 129/77  Pulse: (!) 105  Weight: 179 lb 6.4 oz (81.4 kg)    Fetal Status: Fetal Heart Rate (bpm): 136 Fundal Height: 32 cm Movement: Present     General:  Alert, oriented and cooperative. Patient is in no acute distress.  Skin: Skin is warm and dry. No rash noted.   Cardiovascular: Normal heart rate noted  Respiratory: Normal respiratory effort, no problems with respiration noted  Abdomen: Soft, gravid, appropriate for gestational age.  Pain/Pressure: Absent     Pelvic: Cervical exam deferred        Extremities: Normal range of motion.  Edema: None  Mental Status: Normal mood and affect. Normal behavior. Normal judgment and thought content.   Assessment and Plan:  Pregnancy: G1P0000 at [redacted]w[redacted]d 1. Encounter for supervision of normal first pregnancy in third trimester 28wk labs normal  2. [redacted] weeks gestation of pregnancy  Preterm labor symptoms and general obstetric precautions including but not limited to vaginal bleeding, contractions, leaking of fluid and fetal movement were reviewed in detail with the patient. Please refer to After Visit Summary for other counseling recommendations.   No follow-ups on file.  Future Appointments  Date Time Provider Department Center  12/05/2021   4:10 PM Anyanwu, Jethro Bastos, MD [facility]  12/12/2021  4:10 PM Anyanwu, Jethro Bastos, MD [facility]  12/19/2021  4:10 PM Anyanwu, Jethro Bastos, MD [facility]  12/26/2021  4:10 PM [place] Bing, MD [facility]  03/26/2022  2:30 PM Doreene Burke, CNM [facility] None    [place] Bing, MD

## 2021-12-05 ENCOUNTER — Ambulatory Visit (INDEPENDENT_AMBULATORY_CARE_PROVIDER_SITE_OTHER): Payer: 59 | Admitting: Obstetrics & Gynecology

## 2021-12-05 ENCOUNTER — Encounter: Payer: Self-pay | Admitting: Obstetrics & Gynecology

## 2021-12-05 ENCOUNTER — Other Ambulatory Visit: Payer: Self-pay

## 2021-12-05 VITALS — BP 114/71 | HR 93 | Wt 187.0 lb

## 2021-12-05 DIAGNOSIS — Z3A35 35 weeks gestation of pregnancy: Secondary | ICD-10-CM

## 2021-12-05 DIAGNOSIS — Z3403 Encounter for supervision of normal first pregnancy, third trimester: Secondary | ICD-10-CM

## 2021-12-05 NOTE — Progress Notes (Signed)
° °  PRENATAL VISIT NOTE  Subjective:  Jodi Sanders is a 25 y.o. G1P0000 at [redacted]w[redacted]d being seen today for ongoing prenatal care.  She is currently monitored for the following issues for this low-risk pregnancy and has Encounter for supervision of normal first pregnancy in third trimester on their problem list.  Patient reports no complaints.  Contractions: Not present. Vag. Bleeding: None.  Movement: Present. Denies leaking of fluid.   The following portions of the patient's history were reviewed and updated as appropriate: allergies, current medications, past family history, past medical history, past social history, past surgical history and problem list.   Objective:   Vitals:   12/05/21 1608  BP: 114/71  Pulse: 93  Weight: 187 lb (84.8 kg)    Fetal Status: Fetal Heart Rate (bpm): 132 Fundal Height: 35 cm Movement: Present     General:  Alert, oriented and cooperative. Patient is in no acute distress.  Skin: Skin is warm and dry. No rash noted.   Cardiovascular: Normal heart rate noted  Respiratory: Normal respiratory effort, no problems with respiration noted  Abdomen: Soft, gravid, appropriate for gestational age.  Pain/Pressure: Absent     Pelvic: Cervical exam deferred        Extremities: Normal range of motion.  Edema: Trace  Mental Status: Normal mood and affect. Normal behavior. Normal judgment and thought content.   Assessment and Plan:  Pregnancy: G1P0000 at [redacted]w[redacted]d 1. [redacted] weeks gestation of pregnancy 2. Encounter for supervision of normal first pregnancy in third trimester Pelvic cultures next week. Preterm labor symptoms and general obstetric precautions including but not limited to vaginal bleeding, contractions, leaking of fluid and fetal movement were reviewed in detail with the patient. Please refer to After Visit Summary for other counseling recommendations.   Return in about 1 week (around 12/12/2021) for OB visits weekly scheduled.  Future Appointments  Date  Time Provider Department Center  12/12/2021  4:10 PM Tereso Newcomer, MD [facility]  12/19/2021  4:10 PM Mitchelle Sultan, Jethro Bastos, MD [facility]  12/26/2021  4:10 PM [place] Bing, MD [facility]  01/02/2022  4:10 PM Federico Flake, MD [facility]  03/26/2022  2:30 PM Doreene Burke, CNM [facility] None    Jaynie Collins, MD

## 2021-12-05 NOTE — Patient Instructions (Signed)
Return to office for any scheduled appointments. Call the office or go to the MAU at [facility] at  if:  You begin to have strong, frequent contractions  Your water breaks.  Sometimes it is a big gush of fluid, sometimes it is just a trickle that keeps getting your panties wet or running down your legs  You have vaginal bleeding.  It is normal to have a small amount of spotting if your cervix was checked.   You do not feel your baby moving like normal.  If you do not, get something to eat and drink and lay down and focus on feeling your baby move.   If your baby is still not moving like normal, you should call the office or go to MAU.  Any other obstetric concerns.   

## 2021-12-12 ENCOUNTER — Encounter: Payer: Self-pay | Admitting: Obstetrics & Gynecology

## 2021-12-12 ENCOUNTER — Other Ambulatory Visit: Payer: Self-pay

## 2021-12-12 ENCOUNTER — Ambulatory Visit (INDEPENDENT_AMBULATORY_CARE_PROVIDER_SITE_OTHER): Payer: 59 | Admitting: Obstetrics & Gynecology

## 2021-12-12 ENCOUNTER — Other Ambulatory Visit (HOSPITAL_COMMUNITY)
Admission: RE | Admit: 2021-12-12 | Discharge: 2021-12-12 | Disposition: A | Discharge status: Home / Self Care | Payer: 59 | Source: Ambulatory Visit | Attending: Obstetrics & Gynecology | Admitting: Obstetrics & Gynecology

## 2021-12-12 VITALS — BP 107/70 | HR 90 | Wt 187.0 lb

## 2021-12-12 DIAGNOSIS — Z3403 Encounter for supervision of normal first pregnancy, third trimester: Secondary | ICD-10-CM | POA: Insufficient documentation

## 2021-12-12 DIAGNOSIS — Z3A36 36 weeks gestation of pregnancy: Secondary | ICD-10-CM | POA: Diagnosis present

## 2021-12-12 LAB — OB RESULTS CONSOLE GC/CHLAMYDIA: Gonorrhea: NEGATIVE

## 2021-12-12 NOTE — Patient Instructions (Signed)
Return to office for any scheduled appointments. Call the office or go to the MAU at [facility] at [place] if:  You begin to have strong, frequent contractions  Your water breaks.  Sometimes it is a big gush of fluid, sometimes it is just a trickle that keeps getting your panties wet or running down your legs  You have vaginal bleeding.  It is normal to have a small amount of spotting if your cervix was checked.   You do not feel your baby moving like normal.  If you do not, get something to eat and drink and lay down and focus on feeling your baby move.   If your baby is still not moving like normal, you should call the office or go to MAU.  Any other obstetric concerns.   

## 2021-12-12 NOTE — Progress Notes (Signed)
° °  PRENATAL VISIT NOTE  Subjective:  Jodi Sanders is a 26 y.o. G1P0000 at [redacted]w[redacted]d being seen today for ongoing prenatal care.  She is currently monitored for the following issues for this low-risk pregnancy and has Encounter for supervision of normal first pregnancy in third trimester on their problem list.  Patient reports no complaints.  Contractions: Not present. Vag. Bleeding: None.  Movement: Present. Denies leaking of fluid.   The following portions of the patient's history were reviewed and updated as appropriate: allergies, current medications, past family history, past medical history, past social history, past surgical history and problem list.   Objective:   Vitals:   12/12/21 1614  BP: 107/70  Pulse: 90  Weight: 187 lb (84.8 kg)    Fetal Status: Fetal Heart Rate (bpm): 130 Fundal Height: 37 cm Movement: Present  Presentation: Vertex  General:  Alert, oriented and cooperative. Patient is in no acute distress.  Skin: Skin is warm and dry. No rash noted.   Cardiovascular: Normal heart rate noted  Respiratory: Normal respiratory effort, no problems with respiration noted  Abdomen: Soft, gravid, appropriate for gestational age.  Pain/Pressure: Absent     Pelvic: Cervical exam performed in the presence of a chaperone Dilation: Closed Effacement (%): Thick Station: Ballotable  Extremities: Normal range of motion.  Edema: Trace  Mental Status: Normal mood and affect. Normal behavior. Normal judgment and thought content.   Assessment and Plan:  Pregnancy: G1P0000 at [redacted]w[redacted]d 1. [redacted] weeks gestation of pregnancy 2. Encounter for supervision of normal first pregnancy in third trimester Pelvic cultures done today. - Cervicovaginal ancillary only([place]) - Culture, beta strep (group b only) Preterm labor symptoms and general obstetric precautions including but not limited to vaginal bleeding, contractions, leaking of fluid and fetal movement were reviewed in detail with the  patient. Please refer to After Visit Summary for other counseling recommendations.   Return in about 1 week (around 12/19/2021) for OB visits weekly as scheduled.  Future Appointments  Date Time Provider Department Center  12/19/2021  4:10 PM Joleena Weisenburger, Jethro Bastos, MD [facility]  12/26/2021  4:10 PM Estill Bing, MD [facility]  01/02/2022  4:10 PM Federico Flake, MD [facility]  03/26/2022  2:30 PM Doreene Burke, CNM [facility] None    Jaynie Collins, MD

## 2021-12-14 LAB — CERVICOVAGINAL ANCILLARY ONLY
Chlamydia: NEGATIVE
Comment: NEGATIVE
Comment: NORMAL
Neisseria Gonorrhea: NEGATIVE

## 2021-12-16 LAB — CULTURE, BETA STREP (GROUP B ONLY): Strep Gp B Culture: NEGATIVE

## 2021-12-19 ENCOUNTER — Other Ambulatory Visit: Payer: Self-pay

## 2021-12-19 ENCOUNTER — Ambulatory Visit (INDEPENDENT_AMBULATORY_CARE_PROVIDER_SITE_OTHER): Payer: 59 | Admitting: Obstetrics & Gynecology

## 2021-12-19 ENCOUNTER — Encounter: Payer: Self-pay | Admitting: Obstetrics & Gynecology

## 2021-12-19 VITALS — BP 109/70 | HR 90 | Wt 185.0 lb

## 2021-12-19 DIAGNOSIS — Z3A37 37 weeks gestation of pregnancy: Secondary | ICD-10-CM

## 2021-12-19 DIAGNOSIS — Z3403 Encounter for supervision of normal first pregnancy, third trimester: Secondary | ICD-10-CM

## 2021-12-19 NOTE — Patient Instructions (Signed)
Return to office for any scheduled appointments. Call the office or go to the MAU at [facility] at [place] if:  You begin to have strong, frequent contractions  Your water breaks.  Sometimes it is a big gush of fluid, sometimes it is just a trickle that keeps getting your panties wet or running down your legs  You have vaginal bleeding.  It is normal to have a small amount of spotting if your cervix was checked.   You do not feel your baby moving like normal.  If you do not, get something to eat and drink and lay down and focus on feeling your baby move.   If your baby is still not moving like normal, you should call the office or go to MAU.  Any other obstetric concerns.   

## 2021-12-19 NOTE — Progress Notes (Signed)
° °  PRENATAL VISIT NOTE  Subjective:  Jodi Sanders is a 26 y.o. G1P0000 at [redacted]w[redacted]d being seen today for ongoing prenatal care.  She is currently monitored for the following issues for this low-risk pregnancy and has Encounter for supervision of normal first pregnancy in third trimester on their problem list.  Patient reports no complaints.  Contractions: Not present. Vag. Bleeding: None.  Movement: Present. Denies leaking of fluid.   The following portions of the patient's history were reviewed and updated as appropriate: allergies, current medications, past family history, past medical history, past social history, past surgical history and problem list.   Objective:   Vitals:   12/19/21 1616  BP: 109/70  Pulse: 90  Weight: 185 lb (83.9 kg)    Fetal Status: Fetal Heart Rate (bpm): 145   Movement: Present     General:  Alert, oriented and cooperative. Patient is in no acute distress.  Skin: Skin is warm and dry. No rash noted.   Cardiovascular: Normal heart rate noted  Respiratory: Normal respiratory effort, no problems with respiration noted  Abdomen: Soft, gravid, appropriate for gestational age.  Pain/Pressure: Absent     Pelvic: Cervical exam deferred        Extremities: Normal range of motion.  Edema: Trace  Mental Status: Normal mood and affect. Normal behavior. Normal judgment and thought content.   Assessment and Plan:  Pregnancy: G1P0000 at [redacted]w[redacted]d 1. [redacted] weeks gestation of pregnancy 2. Encounter for supervision of normal first pregnancy in third trimester No concerns.  Preterm labor symptoms and general obstetric precautions including but not limited to vaginal bleeding, contractions, leaking of fluid and fetal movement were reviewed in detail with the patient. Please refer to After Visit Summary for other counseling recommendations.   Return for OB visits as scheduled (next couple can be virtual if desired).  Future Appointments  Date Time Provider Department Center   12/26/2021  4:10 PM [place] Bing, MD [facility]  01/02/2022  4:10 PM Federico Flake, MD [facility]  03/26/2022  2:30 PM Doreene Burke, CNM [facility] None    Jaynie Collins, MD

## 2021-12-26 ENCOUNTER — Ambulatory Visit (INDEPENDENT_AMBULATORY_CARE_PROVIDER_SITE_OTHER): Payer: 59 | Admitting: Obstetrics and Gynecology

## 2021-12-26 ENCOUNTER — Other Ambulatory Visit: Payer: Self-pay

## 2021-12-26 VITALS — BP 108/71 | HR 87 | Wt 191.0 lb

## 2021-12-26 DIAGNOSIS — Z3403 Encounter for supervision of normal first pregnancy, third trimester: Secondary | ICD-10-CM

## 2021-12-26 DIAGNOSIS — Z3A38 38 weeks gestation of pregnancy: Secondary | ICD-10-CM

## 2021-12-27 NOTE — Progress Notes (Signed)
° °  PRENATAL VISIT NOTE  Subjective:  Jodi Sanders is a 26 y.o. G1P0000 at [redacted]w[redacted]d being seen today for ongoing prenatal care.  She is currently monitored for the following issues for this low-risk pregnancy and has Encounter for supervision of normal first pregnancy in third trimester on their problem list.  Patient reports no complaints.  Contractions: Not present. Vag. Bleeding: None.  Movement: Present. Denies leaking of fluid.   The following portions of the patient's history were reviewed and updated as appropriate: allergies, current medications, past family history, past medical history, past social history, past surgical history and problem list.   Objective:   Vitals:   12/26/21 1615  BP: 108/71  Pulse: 87  Weight: 191 lb (86.6 kg)    Fetal Status: Fetal Heart Rate (bpm): 150 Fundal Height: 38 cm Movement: Present  Presentation: Vertex  General:  Alert, oriented and cooperative. Patient is in no acute distress.  Skin: Skin is warm and dry. No rash noted.   Cardiovascular: Normal heart rate noted  Respiratory: Normal respiratory effort, no problems with respiration noted  Abdomen: Soft, gravid, appropriate for gestational age.  Pain/Pressure: Present     Pelvic: Cervical exam deferred        Extremities: Normal range of motion.  Edema: Mild pitting, slight indentation  Mental Status: Normal mood and affect. Normal behavior. Normal judgment and thought content.   Assessment and Plan:  Pregnancy: G1P0000 at [redacted]w[redacted]d 1. [redacted] weeks gestation of pregnancy Routine care. GBS neg. I d/w her re: setting up post dates IOL nv.   Term labor symptoms and general obstetric precautions including but not limited to vaginal bleeding, contractions, leaking of fluid and fetal movement were reviewed in detail with the patient. Please refer to After Visit Summary for other counseling recommendations.   Return in about 1 week (around 01/02/2022) for md or app, low risk ob, in person or  virtual.  Future Appointments  Date Time Provider Department Center  01/02/2022  4:10 PM Federico Flake, MD [facility]  03/26/2022  2:30 PM Doreene Burke, CNM [facility] None    [place] Bing, MD

## 2022-01-02 ENCOUNTER — Other Ambulatory Visit: Payer: Self-pay

## 2022-01-02 ENCOUNTER — Ambulatory Visit (INDEPENDENT_AMBULATORY_CARE_PROVIDER_SITE_OTHER): Payer: 59 | Admitting: Family Medicine

## 2022-01-02 ENCOUNTER — Encounter: Payer: Self-pay | Admitting: Family Medicine

## 2022-01-02 VITALS — BP 118/70 | HR 102 | Ht 67.0 in | Wt 194.8 lb

## 2022-01-02 DIAGNOSIS — Z3403 Encounter for supervision of normal first pregnancy, third trimester: Secondary | ICD-10-CM

## 2022-01-02 NOTE — Progress Notes (Signed)
° °  PRENATAL VISIT NOTE  Subjective:  Jodi Sanders is a 26 y.o. G1P0000 at [redacted]w[redacted]d being seen today for ongoing prenatal care.  She is currently monitored for the following issues for this low-risk pregnancy and has Encounter for supervision of normal first pregnancy in third trimester on their problem list.  Patient reports backache.  Contractions: Not present. Vag. Bleeding: None.  Movement: Present. Denies leaking of fluid.   The following portions of the patient's history were reviewed and updated as appropriate: allergies, current medications, past family history, past medical history, past social history, past surgical history and problem list.   Objective:   Vitals:   01/02/22 1617  BP: 118/70  Pulse: (!) 102  Weight: 194 lb 12.8 oz (88.4 kg)  Height: 5\' 7"  (1.702 m)    Fetal Status: Fetal Heart Rate (bpm): 146 Fundal Height: 39 cm Movement: Present  Presentation: Vertex  General:  Alert, oriented and cooperative. Patient is in no acute distress.  Skin: Skin is warm and dry. No rash noted.   Cardiovascular: Normal heart rate noted  Respiratory: Normal respiratory effort, no problems with respiration noted  Abdomen: Soft, gravid, appropriate for gestational age.  Pain/Pressure: Present     Pelvic: Cervical exam performed in the presence of a chaperone Dilation: Fingertip Effacement (%): Thick Station: -3  Extremities: Normal range of motion.  Edema: Mild pitting, slight indentation  Mental Status: Normal mood and affect. Normal behavior. Normal judgment and thought content.   Assessment and Plan:  Pregnancy: G1P0000 at [redacted]w[redacted]d  1. Encounter for supervision of normal first pregnancy in third trimester Plan for 41 wk postdates daytime IOL- paperwork filled out and scheduled today Reviewed use of dates, stretches, movement, pelvic tilts to help with labor Not a candidate for sweeping today but would like in the future if appropriate Discussed when to go to the hospital  Term  labor symptoms and general obstetric precautions including but not limited to vaginal bleeding, contractions, leaking of fluid and fetal movement were reviewed in detail with the patient. Please refer to After Visit Summary for other counseling recommendations.   Return in about 1 week (around 01/09/2022) for Routine prenatal care.  Future Appointments  Date Time Provider Department Center  03/26/2022  2:30 PM 05/26/2022, CNM [facility] None    Doreene Burke, MD

## 2022-01-03 ENCOUNTER — Encounter (HOSPITAL_COMMUNITY): Payer: Self-pay | Admitting: *Deleted

## 2022-01-03 ENCOUNTER — Telehealth (HOSPITAL_COMMUNITY): Payer: Self-pay | Admitting: *Deleted

## 2022-01-03 ENCOUNTER — Encounter (HOSPITAL_COMMUNITY): Payer: Self-pay

## 2022-01-03 NOTE — Telephone Encounter (Signed)
Preadmission screen  

## 2022-01-08 ENCOUNTER — Other Ambulatory Visit (HOSPITAL_COMMUNITY): Payer: Self-pay | Admitting: Advanced Practice Midwife

## 2022-01-11 ENCOUNTER — Ambulatory Visit (INDEPENDENT_AMBULATORY_CARE_PROVIDER_SITE_OTHER): Payer: 59 | Admitting: Advanced Practice Midwife

## 2022-01-11 ENCOUNTER — Other Ambulatory Visit (HOSPITAL_COMMUNITY): Payer: Self-pay | Admitting: *Deleted

## 2022-01-11 ENCOUNTER — Other Ambulatory Visit: Payer: Self-pay

## 2022-01-11 ENCOUNTER — Ambulatory Visit (INDEPENDENT_AMBULATORY_CARE_PROVIDER_SITE_OTHER): Payer: 59 | Admitting: *Deleted

## 2022-01-11 ENCOUNTER — Ambulatory Visit (INDEPENDENT_AMBULATORY_CARE_PROVIDER_SITE_OTHER): Payer: 59

## 2022-01-11 VITALS — BP 114/74 | HR 108 | Wt 191.0 lb

## 2022-01-11 DIAGNOSIS — O480 Post-term pregnancy: Secondary | ICD-10-CM | POA: Diagnosis not present

## 2022-01-11 DIAGNOSIS — Z3403 Encounter for supervision of normal first pregnancy, third trimester: Secondary | ICD-10-CM

## 2022-01-11 DIAGNOSIS — Z3A40 40 weeks gestation of pregnancy: Secondary | ICD-10-CM

## 2022-01-11 NOTE — Progress Notes (Signed)
° °  PRENATAL VISIT NOTE  Subjective:  Jodi Sanders is a 26 y.o. G1P0000 at [redacted]w[redacted]d being seen today for ongoing prenatal care.  She is currently monitored for the following issues for this low-risk pregnancy and has Encounter for supervision of normal first pregnancy in third trimester on their problem list.  Patient reports no complaints.   .  .   . Denies leaking of fluid.   The following portions of the patient's history were reviewed and updated as appropriate: allergies, current medications, past family history, past medical history, past social history, past surgical history and problem list. Problem list updated.  Objective:  There were no vitals filed for this visit.  Fetal Status:           General:  Alert, oriented and cooperative. Patient is in no acute distress.  Skin: Skin is warm and dry. No rash noted.   Cardiovascular: Normal heart rate noted  Respiratory: Normal respiratory effort, no problems with respiration noted  Abdomen: Soft, gravid, appropriate for gestational age.        Pelvic: Cervical exam performed      1/50/-3  Extremities: Normal range of motion.     Mental Status: Normal mood and affect. Normal behavior. Normal judgment and thought content.   Assessment and Plan:  Pregnancy: G1P0000 at [redacted]w[redacted]d  1. Encounter for supervision of normal first pregnancy in third trimester - Routine care - Discussed membrane sweeping today. Reviewed cochrane review data on membrane sweeping at 39 wks and then at EDD. Reviewed risk of cramping, contractions, bleeding and ROM. Answered patient questions and she agreed to proceed with procedure.   2. [redacted] weeks gestation of pregnancy - IOL scheduled for tomorrow  Term labor symptoms and general obstetric precautions including but not limited to vaginal bleeding, contractions, leaking of fluid and fetal movement were reviewed in detail with the patient. Please refer to After Visit Summary for other counseling recommendations.   No follow-ups on file.  Future Appointments  Date Time Provider Department Center  01/12/2022  6:45 AM [facility] SCHED ROOM [facility] None  02/22/2022  1:50 PM Calvert Cantor, CNM [facility]  03/26/2022  2:30 PM Doreene Burke, CNM [facility] None    Calvert Cantor, [place]

## 2022-01-11 NOTE — Progress Notes (Signed)
Patient seen and assessed by nursing staff.  Agree with documentation and plan.   NST:  Baseline: 155 bpm, Variability: Good {> 6 bpm), Accelerations: Reactive, and Decelerations: Variable: moderate

## 2022-01-12 ENCOUNTER — Encounter (HOSPITAL_COMMUNITY): Payer: Self-pay | Admitting: Family Medicine

## 2022-01-12 ENCOUNTER — Inpatient Hospital Stay (HOSPITAL_COMMUNITY)
Admission: AD | Admit: 2022-01-12 | Discharge: 2022-01-15 | DRG: 807 | Disposition: A | Discharge status: Home / Self Care | Payer: 59 | Attending: Family Medicine | Admitting: Family Medicine

## 2022-01-12 ENCOUNTER — Inpatient Hospital Stay (HOSPITAL_COMMUNITY): Payer: 59

## 2022-01-12 DIAGNOSIS — Z20822 Contact with and (suspected) exposure to covid-19: Secondary | ICD-10-CM | POA: Diagnosis present

## 2022-01-12 DIAGNOSIS — O480 Post-term pregnancy: Secondary | ICD-10-CM | POA: Diagnosis present

## 2022-01-12 DIAGNOSIS — Z3A41 41 weeks gestation of pregnancy: Secondary | ICD-10-CM

## 2022-01-12 DIAGNOSIS — Z3403 Encounter for supervision of normal first pregnancy, third trimester: Secondary | ICD-10-CM

## 2022-01-12 LAB — RESP PANEL BY RT-PCR (FLU A&B, COVID) ARPGX2
Influenza A by PCR: NEGATIVE
Influenza B by PCR: NEGATIVE
SARS Coronavirus 2 by RT PCR: NEGATIVE

## 2022-01-12 LAB — RPR: RPR Ser Ql: NONREACTIVE

## 2022-01-12 LAB — TYPE AND SCREEN
ABO/RH(D): A POS
Antibody Screen: NEGATIVE

## 2022-01-12 LAB — CBC
HCT: 32.7 % — ABNORMAL LOW (ref 36.0–46.0)
Hemoglobin: 10.8 g/dL — ABNORMAL LOW (ref 12.0–15.0)
MCH: 29.8 pg (ref 26.0–34.0)
MCHC: 33 g/dL (ref 30.0–36.0)
MCV: 90.3 fL (ref 80.0–100.0)
Platelets: 165 10*3/uL (ref 150–400)
RBC: 3.62 MIL/uL — ABNORMAL LOW (ref 3.87–5.11)
RDW: 13.2 % (ref 11.5–15.5)
WBC: 7 10*3/uL (ref 4.0–10.5)
nRBC: 0 % (ref 0.0–0.2)

## 2022-01-12 MED ORDER — SOD CITRATE-CITRIC ACID 500-334 MG/5ML PO SOLN
30.0000 mL | ORAL | Status: DC | PRN
Start: 2022-01-12 — End: 2022-01-13

## 2022-01-12 MED ORDER — OXYCODONE-ACETAMINOPHEN 5-325 MG PO TABS: 1.0000 | ORAL_TABLET | ORAL | Status: DC | PRN

## 2022-01-12 MED ORDER — FENTANYL CITRATE (PF) 100 MCG/2ML IJ SOLN: 50.0000 ug | INTRAMUSCULAR | Status: DC | PRN

## 2022-01-12 MED ORDER — OXYTOCIN-SODIUM CHLORIDE 30-0.9 UT/500ML-% IV SOLN
2.5000 [IU]/h | INTRAVENOUS | Status: DC
  Filled 2022-01-12: qty 500

## 2022-01-12 MED ORDER — MISOPROSTOL 25 MCG QUARTER TABLET: 25.0000 ug | ORAL_TABLET | ORAL | Status: DC | PRN

## 2022-01-12 MED ORDER — LACTATED RINGERS IV SOLN: 500.0000 mL | INTRAVENOUS | Status: DC | PRN

## 2022-01-12 MED ORDER — PHENYLEPHRINE 40 MCG/ML (10ML) SYRINGE FOR IV PUSH (FOR BLOOD PRESSURE SUPPORT): 80.0000 ug | PREFILLED_SYRINGE | INTRAVENOUS | Status: DC | PRN

## 2022-01-12 MED ORDER — DIPHENHYDRAMINE HCL 50 MG/ML IJ SOLN: 12.5000 mg | INTRAMUSCULAR | Status: DC | PRN

## 2022-01-12 MED ORDER — LACTATED RINGERS IV SOLN: INTRAVENOUS | Status: DC

## 2022-01-12 MED ORDER — LACTATED RINGERS IV SOLN
500.0000 mL | Freq: Once | INTRAVENOUS | Status: AC
  Administered 2022-01-13: 500 mL via INTRAVENOUS

## 2022-01-12 MED ORDER — HYDROXYZINE HCL 50 MG PO TABS: 50.0000 mg | ORAL_TABLET | Freq: Four times a day (QID) | ORAL | Status: DC | PRN

## 2022-01-12 MED ORDER — MISOPROSTOL 50MCG HALF TABLET
50.0000 ug | ORAL_TABLET | ORAL | Status: DC | PRN
  Administered 2022-01-12: 50 ug via BUCCAL
  Filled 2022-01-12: qty 1

## 2022-01-12 MED ORDER — OXYCODONE-ACETAMINOPHEN 5-325 MG PO TABS: 2.0000 | ORAL_TABLET | ORAL | Status: DC | PRN

## 2022-01-12 MED ORDER — FLEET ENEMA 7-19 GM/118ML RE ENEM
1.0000 | ENEMA | RECTAL | Status: DC | PRN
Start: 2022-01-12 — End: 2022-01-13

## 2022-01-12 MED ORDER — ACETAMINOPHEN 325 MG PO TABS
650.0000 mg | ORAL_TABLET | ORAL | Status: DC | PRN
Start: 2022-01-12 — End: 2022-01-13

## 2022-01-12 MED ORDER — ONDANSETRON HCL 4 MG/2ML IJ SOLN
4.0000 mg | Freq: Four times a day (QID) | INTRAMUSCULAR | Status: DC | PRN
Start: 2022-01-12 — End: 2022-01-13

## 2022-01-12 MED ORDER — EPHEDRINE 5 MG/ML INJ: 10.0000 mg | INTRAVENOUS | Status: DC | PRN

## 2022-01-12 MED ORDER — TERBUTALINE SULFATE 1 MG/ML IJ SOLN: 0.2500 mg | Freq: Once | INTRAMUSCULAR | Status: DC | PRN

## 2022-01-12 MED ORDER — OXYTOCIN BOLUS FROM INFUSION
333.0000 mL | Freq: Once | INTRAVENOUS | Status: AC
  Administered 2022-01-13: 333 mL via INTRAVENOUS

## 2022-01-12 MED ORDER — LIDOCAINE HCL (PF) 1 % IJ SOLN: 30.0000 mL | INTRAMUSCULAR | Status: DC | PRN

## 2022-01-12 MED ORDER — OXYTOCIN-SODIUM CHLORIDE 30-0.9 UT/500ML-% IV SOLN
1.0000 m[IU]/min | INTRAVENOUS | Status: DC
  Administered 2022-01-12: 2 m[IU]/min via INTRAVENOUS

## 2022-01-12 MED ORDER — FENTANYL-BUPIVACAINE-NACL 0.5-0.125-0.9 MG/250ML-% EP SOLN
12.0000 mL/h | EPIDURAL | Status: DC | PRN
  Administered 2022-01-13: 12 mL/h via EPIDURAL
  Filled 2022-01-12: qty 250

## 2022-01-12 NOTE — Progress Notes (Signed)
Jodi Sanders is a 26 y.o. G1P0000 at [redacted]w[redacted]d   Subjective: Standing at bedside, rocking through contractions, pain score reported as 6-7/10  Objective: BP 121/67    Pulse (!) 105    Temp 98.3 F (36.8 C) (Oral)    Resp 16    Ht 5\' 7"  (1.702 m)    Wt 87.1 kg    LMP 03/31/2021 (Approximate)    BMI 30.07 kg/m  No intake/output data recorded. No intake/output data recorded.  FHT:  FHR: 140 bpm, variability: moderate,  accelerations:  Present,  decelerations:  Absent UC:   irregular, every 3-5 minutes per patient report SVE:   Dilation: 4 Effacement (%): 70 Station: -3 Exam by:: Dr. [email or web address]  Labs: Lab Results  Component Value Date   WBC 7.0 01/12/2022   HGB 10.8 (L) 01/12/2022   HCT 32.7 (L) 01/12/2022   MCV 90.3 01/12/2022   PLT 165 01/12/2022    Assessment / Plan: --26 y.o. G1P0000 at [redacted]w[redacted]d admitted for PDIOL --GBS Neg --Cat I tracing --Pitocin initiated at 1800 hours, titrate PRN --Reassess when 5 hours from previous exam (about 2300 hours) --Anticipate vaginal birth  [redacted]w[redacted]d, Jodi Sanders 01/12/2022, 9:04 PM

## 2022-01-12 NOTE — Progress Notes (Signed)
Labor Progress Note TAYLI BUCH is a 26 y.o. G1P0000 at [redacted]w[redacted]d presented for IOL pd.   S: FB just came out and checked by RN. Patient reports feeling much better now that it is out. Still having some cramping.   O:  BP (!) 118/53    Pulse 96    Temp 98.4 F (36.9 C) (Oral)    Resp 18    Ht 5\' 7"  (1.702 m)    Wt 87.1 kg    LMP 03/31/2021 (Approximate)    BMI 30.07 kg/m  EFM: 140/mod/15x15  CVE: Dilation: 4 Effacement (%): 50 Station: -3 Presentation: Vertex Exam by:: [email or web address] RN   A&P: 26 y.o. G1P0000 [redacted]w[redacted]d  #Labor: Progressing well. Still contracting frequently with last cytotec, will allow this to space and give additional cytotec. Hopeful to be able to transition to pit on next check.  #Pain: PRN  #FWB: Cat I  #GBS negative   [redacted]w[redacted]d, DO 2:26 PM

## 2022-01-12 NOTE — Progress Notes (Signed)
Labor Progress Note Jodi Sanders is a 26 y.o. G1P0000 at [redacted]w[redacted]d presented for pd IOL.   S: Doing well. Feeling cramping but not super uncomfortable.   O:  BP (!) 116/58    Pulse 87    Temp 98.1 F (36.7 C) (Oral)    Resp 16    Ht 5\' 7"  (1.702 m)    Wt 87.1 kg    LMP 03/31/2021 (Approximate)    BMI 30.07 kg/m  EFM: 140/mod/15x15/none  CVE: Dilation: 4 Effacement (%): 70 Station: -3 Presentation: Vertex Exam by:: Dr. Higinio Plan   A&P: 26 y.o. G1P0000 [redacted]w[redacted]d  #Labor: Progressing well s/p FB and cyto. Did not end up receiving 2nd cytotec due to frequent contractions, but these have spaced and feeling more comfortable. Will start pit 2x2.  #Pain: PRN, planning for epidural in the future  #FWB: Cat I  #GBS negative  Patriciaann Clan, DO 7:06 PM

## 2022-01-12 NOTE — H&P (Addendum)
OBSTETRIC ADMISSION HISTORY AND PHYSICAL  Jodi Sanders is a 26 y.o. female G1P0000 with IUP at [redacted]w[redacted]d by LMP presenting for postdates IOL. She reports +FMs, No LOF, no VB, no blurry vision, headaches or peripheral edema, and RUQ pain.  She plans on breast and bottle feeding. She request nexplanon (outpatient) for birth control. She received her prenatal care at  [facility]    Dating: By LMP --->  Estimated Date of Delivery: 01/05/22  Sono:    @[redacted]w[redacted]d , CWD, normal anatomy, breech presentation, 283 g, 36% EFW   Prenatal History/Complications:  --Postdates, no significant complications during pregnancy  Past Medical History: Past Medical History:  Diagnosis Date   Medical history non-contributory    No pertinent past medical history     Past Surgical History: Past Surgical History:  Procedure Laterality Date   TONSILLECTOMY      Obstetrical History: OB History     Gravida  1   Para  0   Term  0   Preterm  0   AB  0   Living  0      SAB  0   IAB  0   Ectopic  0   Multiple  0   Live Births  0           Social History Social History   Socioeconomic History   Marital status: Married    Spouse name: Not on file   Number of children: Not on file   Years of education: Not on file   Highest education level: Not on file  Occupational History   Occupation: Teller  Tobacco Use   Smoking status: Never   Smokeless tobacco: Never  Vaping Use   Vaping Use: Former   Quit date: 03/22/2021  Substance and Sexual Activity   Alcohol use: Not Currently    Comment: occasionally   Drug use: No   Sexual activity: Yes    Birth control/protection: None  Other Topics Concern   Not on file  Social History Narrative   Not on file   Social Determinants of Health   Financial Resource Strain: Not on file  Food Insecurity: Not on file  Transportation Needs: Not on file  Physical Activity: Not on file  Stress: Not on file  Social Connections: Not on file     Family History: Family History  Problem Relation Age of Onset   Healthy Mother    Healthy Father    Heart disease Paternal Grandfather     Allergies: No Known Allergies  Medications Prior to Admission  Medication Sig Dispense Refill Last Dose   acetaminophen (TYLENOL) 500 MG tablet Take 2 tablets (1,000 mg total) by mouth every 6 (six) hours as needed for moderate pain. 30 tablet 0    Prenatal Vit-Fe Fumarate-FA (MULTIVITAMIN-PRENATAL) 27-0.8 MG TABS tablet Take 1 tablet by mouth daily at 12 noon.        Review of Systems   All systems reviewed and negative except as stated in HPI  Blood pressure 123/69, pulse 99, temperature 98.5 F (36.9 C), temperature source Oral, resp. rate 18, height 5\' 7"  (1.702 m), weight 87.1 kg, last menstrual period 03/31/2021. General appearance: alert and no distress Lungs: no iWOB Heart: regular rate and rhythm Abdomen: soft, non-tender; bowel sounds normal Extremities: no sign of DVT Presentation: cephalic Fetal monitoringBaseline: 150 bpm, moderate varibility, + accels Uterine activity: occasional, around every 9 minutes Dilation: 1 Effacement (%): 50 Exam by:: Dr. 04/02/2021  Prenatal labs: ABO, Rh: --/--/  A POS (02/24 6063) Antibody: NEG (02/24 0823) Rubella: 2.10 (08/03 1454) RPR: Non Reactive (11/29 0837)  HBsAg: Negative (08/03 1454)  HIV: Non Reactive (11/29 0837)  GBS: Negative/-- (01/24 1629)  2 hr Glucola 86 Genetic screening  declined Anatomy US normal  Prenatal Transfer Tool  Maternal Diabetes: No Genetic Screening: Declined Maternal Ultrasounds/Referrals: Normal Fetal Ultrasounds or other Referrals:  None, normal anatomy and BPP 8/8 on 01/12/2022 Maternal Substance Abuse:  No Significant Maternal Medications:  None Significant Maternal Lab Results: Group B Strep negative  Results for orders placed or performed during the hospital encounter of 01/12/22 (from the past 24 hour(s))  CBC   Collection Time: 01/12/22   8:23 AM  Result Value Ref Range   WBC 7.0 4.0 - 10.5 K/uL   RBC 3.62 (L) 3.87 - 5.11 MIL/uL   Hemoglobin 10.8 (L) 12.0 - 15.0 g/dL   HCT 01.6 (L) 01.0 - 93.2 %   MCV 90.3 80.0 - 100.0 fL   MCH 29.8 26.0 - 34.0 pg   MCHC 33.0 30.0 - 36.0 g/dL   RDW 35.5 73.2 - 20.2 %   Platelets 165 150 - 400 K/uL   nRBC 0.0 0.0 - 0.2 %  Type and screen   Collection Time: 01/12/22  8:23 AM  Result Value Ref Range   ABO/RH(D) A POS    Antibody Screen NEG    Sample Expiration      01/15/2022,2359 Performed at [facility], [address], [place], [place] [address]     Patient Active Problem List   Diagnosis Date Noted   Indication for care in labor or delivery 01/12/2022   Encounter for supervision of normal first pregnancy in third trimester 06/08/2021    Assessment/Plan:  ERNESHA RAMONE is a 26 y.o. G1P0000 at [redacted]w[redacted]d here for IOL d/t post dates  #Labor: IOL with buccal cytotec and foley balloon placement #Pain: Plan for epidural #FWB: Cat I #ID:  GBS negative #MOF: Breast and bottle #MOC: Nexplanon outpatient per patient request  #Circ:  Desires inpatient  Levin Erp, MD  01/12/2022, 9:56 AM  GME ATTESTATION:  I saw and evaluated the patient. I agree with the findings and the plan of care as documented in the residents note.  Leticia Penna, DO OB Fellow, Faculty [facility], Center for [facility] 01/12/2022 7:13 PM

## 2022-01-13 ENCOUNTER — Encounter (HOSPITAL_COMMUNITY): Payer: Self-pay | Admitting: Family Medicine

## 2022-01-13 ENCOUNTER — Inpatient Hospital Stay (HOSPITAL_COMMUNITY): Payer: 59 | Admitting: Anesthesiology

## 2022-01-13 DIAGNOSIS — Z3A41 41 weeks gestation of pregnancy: Secondary | ICD-10-CM

## 2022-01-13 DIAGNOSIS — O480 Post-term pregnancy: Secondary | ICD-10-CM

## 2022-01-13 MED ORDER — WITCH HAZEL-GLYCERIN EX PADS: 1.0000 "application " | MEDICATED_PAD | CUTANEOUS | Status: DC | PRN

## 2022-01-13 MED ORDER — DIBUCAINE (PERIANAL) 1 % EX OINT: 1.0000 "application " | TOPICAL_OINTMENT | CUTANEOUS | Status: DC | PRN

## 2022-01-13 MED ORDER — LIDOCAINE HCL (PF) 1 % IJ SOLN
INTRAMUSCULAR | Status: DC | PRN
  Administered 2022-01-13 (×2): 5 mL via EPIDURAL

## 2022-01-13 MED ORDER — ZOLPIDEM TARTRATE 5 MG PO TABS: 5.0000 mg | ORAL_TABLET | Freq: Every evening | ORAL | Status: DC | PRN

## 2022-01-13 MED ORDER — ACETAMINOPHEN 325 MG PO TABS
650.0000 mg | ORAL_TABLET | ORAL | Status: DC | PRN
  Administered 2022-01-13 – 2022-01-15 (×6): 650 mg via ORAL
  Filled 2022-01-13 (×6): qty 2

## 2022-01-13 MED ORDER — TETANUS-DIPHTH-ACELL PERTUSSIS 5-2.5-18.5 LF-MCG/0.5 IM SUSY: 0.5000 mL | PREFILLED_SYRINGE | Freq: Once | INTRAMUSCULAR | Status: DC

## 2022-01-13 MED ORDER — SIMETHICONE 80 MG PO CHEW: 80.0000 mg | CHEWABLE_TABLET | ORAL | Status: DC | PRN

## 2022-01-13 MED ORDER — SENNOSIDES-DOCUSATE SODIUM 8.6-50 MG PO TABS
2.0000 | ORAL_TABLET | Freq: Every day | ORAL | Status: DC
  Administered 2022-01-14 – 2022-01-15 (×2): 2 via ORAL
  Filled 2022-01-13 (×2): qty 2

## 2022-01-13 MED ORDER — BENZOCAINE-MENTHOL 20-0.5 % EX AERO
1.0000 "application " | INHALATION_SPRAY | CUTANEOUS | Status: DC | PRN
  Administered 2022-01-13: 1 via TOPICAL
  Filled 2022-01-13 (×2): qty 56

## 2022-01-13 MED ORDER — MEDROXYPROGESTERONE ACETATE 150 MG/ML IM SUSP: 150.0000 mg | INTRAMUSCULAR | Status: DC | PRN

## 2022-01-13 MED ORDER — ONDANSETRON HCL 4 MG/2ML IJ SOLN: 4.0000 mg | INTRAMUSCULAR | Status: DC | PRN

## 2022-01-13 MED ORDER — ONDANSETRON HCL 4 MG PO TABS: 4.0000 mg | ORAL_TABLET | ORAL | Status: DC | PRN

## 2022-01-13 MED ORDER — PRENATAL MULTIVITAMIN CH
1.0000 | ORAL_TABLET | Freq: Every day | ORAL | Status: DC
  Administered 2022-01-13 – 2022-01-15 (×3): 1 via ORAL
  Filled 2022-01-13 (×3): qty 1

## 2022-01-13 MED ORDER — IBUPROFEN 600 MG PO TABS
600.0000 mg | ORAL_TABLET | Freq: Four times a day (QID) | ORAL | Status: DC
  Administered 2022-01-13 – 2022-01-15 (×9): 600 mg via ORAL
  Filled 2022-01-13 (×9): qty 1

## 2022-01-13 MED ORDER — COCONUT OIL OIL
1.0000 "application " | TOPICAL_OIL | Status: DC | PRN
  Administered 2022-01-13: 1 via TOPICAL

## 2022-01-13 MED ORDER — DIPHENHYDRAMINE HCL 25 MG PO CAPS
25.0000 mg | ORAL_CAPSULE | Freq: Four times a day (QID) | ORAL | Status: DC | PRN
Start: 2022-01-13 — End: 2022-01-15

## 2022-01-13 NOTE — Discharge Summary (Addendum)
Postpartum Discharge Summary     Patient Name: Jodi Sanders DOB: 11/16/1996 MRN: [id number]  Date of admission: 01/12/2022 Delivery date:01/13/2022  Delivering provider: Rolm Bookbinder  Date of discharge: 01/15/2022  Admitting diagnosis: Indication for care in labor or delivery [O75.9] Intrauterine pregnancy: [redacted]w[redacted]d     Secondary diagnosis:  Principal Problem:   Indication for care in labor or delivery  Additional problems: None    Discharge diagnosis: Term Pregnancy Delivered                                              Post partum procedures: None Augmentation: Pitocin, Cytotec, and IP Foley Complications: None  Hospital course: Induction of Labor With Vaginal Delivery   26 y.o. yo G1P0000 at [redacted]w[redacted]d was admitted to the hospital 01/12/2022 for induction of labor.  Indication for induction: Postdates.  Patient had an uncomplicated labor course as follows: Membrane Rupture Time/Date: 4:53 AM ,01/13/2022   Delivery Method:Vaginal, Spontaneous  Episiotomy: None  Lacerations:  2nd degree;Perineal  Details of delivery can be found in separate delivery note.  Patient had a routine postpartum course. Patient is discharged home 01/15/22.  Newborn Data: Birth date:01/13/2022  Birth time:11:33 AM  Gender:Female  Living status:Living  Apgars:9 ,9  Weight:3950 g   Magnesium Sulfate received: No BMZ received: No Rhophylac:N/A  MMR:No T-DaP: declined Flu: No Transfusion:No  Physical exam  Vitals:   01/14/22 0434 01/14/22 1639 01/14/22 2349 01/15/22 0435  BP: 106/66 112/72 117/70 113/69  Pulse: 83 90 83 98  Resp:      Temp: 98.3 F (36.8 C) 98.6 F (37 C) 98.1 F (36.7 C) 97.8 F (36.6 C)  TempSrc: Oral   Oral  SpO2: 98% 98% 97% 100%  Weight:      Height:       General: alert, cooperative, and no distress Lochia: appropriate Uterine Fundus: firm and below umbilicus DVT Evaluation: No cords or calf tenderness. No significant calf/ankle edema.  Labs: Lab Results   Component Value Date   WBC 7.0 01/12/2022   HGB 10.8 (L) 01/12/2022   HCT 32.7 (L) 01/12/2022   MCV 90.3 01/12/2022   PLT 165 01/12/2022   CMP Latest Ref Rng & Units 07/24/2016  Glucose 65 - 99 mg/dL 236(J)  BUN 6 - 20 mg/dL 13  Creatinine 4.03 - 8.87 mg/dL 8.32  Sodium 800 - 439 mmol/L 138  Potassium 3.5 - 5.1 mmol/L 3.5  Chloride 101 - 111 mmol/L 107  CO2 22 - 32 mmol/L 24  Calcium 8.9 - 10.3 mg/dL 8.9  Total Protein 6.5 - 8.1 g/dL 8.1  Total Bilirubin 0.3 - 1.2 mg/dL 8.2(E)  Alkaline Phos 38 - 126 U/L 58  AST 15 - 41 U/L 28  ALT 14 - 54 U/L 23   Edinburgh Score: No flowsheet data found.   After visit meds:  Allergies as of 01/15/2022   No Known Allergies      Medication List     TAKE these medications    acetaminophen 500 MG tablet Commonly known as: TYLENOL Take 2 tablets (1,000 mg total) by mouth every 6 (six) hours as needed for moderate pain.   ibuprofen 600 MG tablet Commonly known as: ADVIL Take 1 tablet (600 mg total) by mouth every 6 (six) hours.   multivitamin-prenatal 27-0.8 MG Tabs tablet Take 1 tablet by mouth daily at 12 noon.  Discharge home in stable condition Infant Feeding: Bottle and Breast Infant Disposition:home with mother Discharge instruction: per After Visit Summary and Postpartum booklet. Activity: Advance as tolerated. Pelvic rest for 6 weeks.  Diet: routine diet Future Appointments: Future Appointments  Date Time Provider [place]  02/22/2022  1:50 PM Darlina Rumpf, CNM [facility]  03/26/2022  2:30 PM Philip Aspen, CNM [facility] None   Follow up Visit:   Please schedule this patient for a In person postpartum visit in 4 weeks with the following provider: Any provider. Additional Postpartum F/U: n/a   Low risk pregnancy complicated by:  n/a Delivery mode:  Vaginal, Spontaneous  Anticipated Birth Control:  Nexplanon outpatient   Karsten Fells, DO PGY-1 01/15/2022, 8:41 AM  GME  ATTESTATION:  I saw and evaluated the patient. I agree with the findings and the plan of care as documented in the residents note.  Darrelyn Hillock, DO OB Fellow, [place] for [place] 01/15/2022 8:21 PM

## 2022-01-13 NOTE — Lactation Note (Signed)
This note was copied from a baby's chart. Lactation Consultation Note  Patient Name: Jodi Sanders [id number] Date: 01/13/2022 Reason for consult: L&D Initial assessment;Primapara;1st time breastfeeding;Term Age:27 hours  LC in to assist with P1 Mom of term baby.  Baby sleeping STS, showing subtle feeding cues.  LC assisted in laid back position.  Baby not able to sustain a deep latch.   Reviewed hand expression, colostrum easily expressed (Mom states she pumped prenatally 4 times and has 4 storage bags full of colostrum, 4 oz each.  Colostrum is frozen at home)  Switched to football hold.  Baby able to latch with guidance and suck for 4-5 times before pushing off.    LC left baby STS in football hold with good support.  Mom to ask for assistance on MBU.  Maternal Data Has patient been taught Hand Expression?: Yes Does the patient have breastfeeding experience prior to this delivery?: No  Feeding Mother's Current Feeding Choice: Breast Milk  LATCH Score Latch: Repeated attempts needed to sustain latch, nipple held in mouth throughout feeding, stimulation needed to elicit sucking reflex.  Audible Swallowing: None  Type of Nipple: Everted at rest and after stimulation (short nipples)  Comfort (Breast/Nipple): Soft / non-tender  Hold (Positioning): Assistance needed to correctly position infant at breast and maintain latch.  LATCH Score: 6  Interventions Interventions: Breast feeding basics reviewed;Assisted with latch;Skin to skin;Breast massage;Hand express;Support pillows;Adjust position;Position options;Expressed milk  Consult Status Consult Status: Follow-up from L&D Date: 01/13/22 Follow-up type: In-patient    Judee Clara 01/13/2022, 12:38 PM

## 2022-01-13 NOTE — Anesthesia Procedure Notes (Signed)
Epidural Patient location during procedure: OB Start time: 01/13/2022 12:25 AM End time: 01/13/2022 12:33 AM  Staffing Anesthesiologist: Mal Amabile, MD Performed: anesthesiologist   Preanesthetic Checklist Completed: patient identified, IV checked, site marked, risks and benefits discussed, surgical consent, monitors and equipment checked, pre-op evaluation and timeout performed  Epidural Patient position: sitting Prep: DuraPrep and site prepped and draped Patient monitoring: continuous pulse ox and blood pressure Approach: midline Location: L3-L4 Injection technique: LOR air  Needle:  Needle type: Tuohy  Needle gauge: 17 G Needle length: 9 cm and 9 Needle insertion depth: 6 cm Catheter type: closed end flexible Catheter size: 19 Gauge Catheter at skin depth: 11 cm Test dose: negative and Other  Assessment Events: blood not aspirated, injection not painful, no injection resistance, no paresthesia and negative IV test  Additional Notes Patient identified. Risks and benefits discussed including failed block, incomplete  Pain control, post dural puncture headache, nerve damage, paralysis, blood pressure Changes, nausea, vomiting, reactions to medications-both toxic and allergic and post Partum back pain. All questions were answered. Patient expressed understanding and wished to proceed. Sterile technique was used throughout procedure. Epidural site was Dressed with sterile barrier dressing. No paresthesias, signs of intravascular injection Or signs of intrathecal spread were encountered.  Patient was more comfortable after the epidural was dosed. Please see RN's note for documentation of vital signs and FHR which are stable. Reason for block:procedure for pain

## 2022-01-13 NOTE — Progress Notes (Signed)
Jodi Sanders is a 26 y.o. G1P0000 at [redacted]w[redacted]d   Subjective: Dozing comfortably s/p epidural.  Objective: BP 104/66    Pulse 74    Temp 98.3 F (36.8 C) (Oral)    Resp 18    Ht 5\' 7"  (1.702 m)    Wt 87.1 kg    LMP 03/31/2021 (Approximate)    BMI 30.07 kg/m  No intake/output data recorded. Total I/O In: -  Out: 700 [Urine:700]  FHT:  FHR: 135 bpm, variability: moderate,  accelerations:  Present,  decelerations:  Absent UC:   regular, every 1.5-4 minutes SVE:   Dilation: 6 Effacement (%): 80 Station: 0 Exam by:: [email or web address], CNM  Labs: Lab Results  Component Value Date   WBC 7.0 01/12/2022   HGB 10.8 (L) 01/12/2022   HCT 32.7 (L) 01/12/2022   MCV 90.3 01/12/2022   PLT 165 01/12/2022    Assessment / Plan: --26 y.o. G1P0000 at [redacted]w[redacted]d  --Cat I tracing --Pitocin initiated at 1800 hours, currently at 10 milliunits --SROM clear fluid at 0453 --Continue Pitocin titration --Anticipate vaginal delivery  [redacted]w[redacted]d, CNM 01/13/2022, 5:59 AM

## 2022-01-13 NOTE — Anesthesia Preprocedure Evaluation (Signed)
Anesthesia Evaluation  Patient identified by MRN, date of birth, ID band Patient awake    Reviewed: Allergy & Precautions, Patient's Chart, lab work & pertinent test results  Airway Mallampati: II       Dental no notable dental hx.    Pulmonary neg pulmonary ROS,    Pulmonary exam normal        Cardiovascular negative cardio ROS Normal cardiovascular exam     Neuro/Psych negative neurological ROS  negative psych ROS   GI/Hepatic negative GI ROS, Neg liver ROS,   Endo/Other  negative endocrine ROS  Renal/GU negative Renal ROS  negative genitourinary   Musculoskeletal negative musculoskeletal ROS (+)   Abdominal   Peds  Hematology  (+) Blood dyscrasia, anemia ,   Anesthesia Other Findings   Reproductive/Obstetrics (+) Pregnancy                             Anesthesia Physical Anesthesia Plan  ASA: 2  Anesthesia Plan: Epidural   Post-op Pain Management:    Induction:   PONV Risk Score and Plan:   Airway Management Planned: Natural Airway  Additional Equipment:   Intra-op Plan:   Post-operative Plan:   Informed Consent: I have reviewed the patients History and Physical, chart, labs and discussed the procedure including the risks, benefits and alternatives for the proposed anesthesia with the patient or authorized representative who has indicated his/her understanding and acceptance.       Plan Discussed with: Anesthesiologist  Anesthesia Plan Comments:         Anesthesia Quick Evaluation

## 2022-01-13 NOTE — Lactation Note (Signed)
This note was copied from a baby's chart. Lactation Consultation Note Mom awake resting. Baby sleeping. Mom has pumped 10 ml colostrum. Mom stated she gave some w/curve tip syring and gloved finger at last feeding but he isn't able to latch. Asked mom to call for [facility] for next feeding for latch assistance. Mom stated OK. Milk storage reviewed.  Patient Name: Jodi Sanders [id number] Date: 01/13/2022 Reason for consult: Follow-up assessment;Primapara;Term Age:73 hours  Maternal Data    Feeding    LATCH Score Latch: Repeated attempts needed to sustain latch, nipple held in mouth throughout feeding, stimulation needed to elicit sucking reflex.  Audible Swallowing: None  Type of Nipple: Flat  Comfort (Breast/Nipple): Soft / non-tender  Hold (Positioning): Full assist, staff holds infant at breast  LATCH Score: 4   Lactation Tools Discussed/Used    Interventions Interventions: DEBP  Discharge    Consult Status Consult Status: Follow-up Date: 01/13/22 Follow-up type: In-patient    Charyl Dancer 01/13/2022, 8:20 PM

## 2022-01-13 NOTE — Lactation Note (Signed)
This note was copied from a baby's chart. Lactation Consultation Note Mom called for latch assistance. Baby unable to maintain latch. LC had to t-cup hold nipple in baby's mouth then after he suckled a few times then he pushed nipple out w/his tongue. LC assessed suck. Baby is humping tongue up in back of mouth. Has high palate, labial frenulum. Baby can move tongue past gums. Curls tongue up when cried. Baby doesn't relax tongue and place tongue under finger when suckling. Explained to mom baby is new and is learning to feed. Mom had NS #24. LC placed on NS w/explanation. Mom's breast is soft, needing both of [place] firming breast so baby could latch. Baby was able to stay on and suckle when he wanted to but NS was sinking into breast some even w/breast being firmed. Mom has colostrum in refrigerator. LC warmed 5 ml and gave w/curve tip syring. Every time baby suckle on gloved finger I could feel his tongue trying to push finger out.  [place] asked RN to put in for ST consult.  Patient Name: Jodi Sanders [id number] Date: 01/13/2022 Reason for consult: Mother's request;Primapara;Term Age:76 hours  Maternal Data    Feeding    LATCH Score Latch: Grasps breast easily, tongue down, lips flanged, rhythmical sucking.  Audible Swallowing: None  Type of Nipple: Flat  Comfort (Breast/Nipple): Soft / non-tender  Hold (Positioning): Full assist, staff holds infant at breast  LATCH Score: 5   Lactation Tools Discussed/Used Tools: Shells;Nipple Shields Nipple shield size: 24  Interventions Interventions: Breast feeding basics reviewed;Assisted with latch;Skin to skin;Breast massage;Hand express;Pre-pump if needed;Breast compression;Adjust position;Support pillows;Position options;Expressed milk;Shells;DEBP  Discharge    Consult Status Consult Status: Follow-up Date: 01/14/22 Follow-up type: In-patient    Theodoro Kalata 01/13/2022, 10:37 PM

## 2022-01-13 NOTE — Progress Notes (Signed)
Jodi Sanders is a 26 y.o. G1P0000 at [redacted]w[redacted]d   Subjective: Sitting in high fowler's, breathing deeply and wincing during contractions. Poses questions regarding options for pain medication and strongly considering epidural.  Objective: BP 127/67    Pulse 86    Temp 98.3 F (36.8 C) (Oral)    Resp 18    Ht 5\' 7"  (1.702 m)    Wt 87.1 kg    LMP 03/31/2021 (Approximate)    BMI 30.07 kg/m  No intake/output data recorded. No intake/output data recorded.  FHT:  FHR: 135 bpm, variability: moderate,  accelerations:  Present,  decelerations:  Absent UC:   regular, every 2 minutes SVE:   Dilation: 4 Effacement (%): 70 Station: -3 Exam by:: [email or web address], RN  Labs: Lab Results  Component Value Date   WBC 7.0 01/12/2022   HGB 10.8 (L) 01/12/2022   HCT 32.7 (L) 01/12/2022   MCV 90.3 01/12/2022   PLT 165 01/12/2022    Assessment / Plan: --26 y.o. G1P0000 at [redacted]w[redacted]d admitted for PDIOL --Cat I tracing --May have epidural now --Continue Pitocin titration PRN --Assess for AROM with next exam --Anticipate vaginal delivery  June, CNM 01/13/2022, 1:05 AM

## 2022-01-13 NOTE — Lactation Note (Signed)
This note was copied from a baby's chart. Lactation Consultation Note  Patient Name: Jodi Sanders [id number] Date: 01/13/2022 Reason for consult: Initial assessment Age:27 hours  P1, Baby sleeping and mother eating.  Mother states she knows how to hand express.  Family states they have two sets of visitors coming for a brief visit and they will attempt latching again after.  Family states they have 4 - 4 oz. bags of frozen colostrum at home that family members is bringing to the hospital.  Discussed warming under warm water to thaw.  Brought in syringe and cup for feeding.   Family states baby has been having short feeding and not sustaining latch.  Visitors entered room during consult. Suggest calling Legrand Como to assist  later for latch assistance.    Maternal Data Has patient been taught Hand Expression?: Yes Does the patient have breastfeeding experience prior to this delivery?: No  Feeding Mother's Current Feeding Choice: Breast Milk  LATCH Score Latch: Repeated attempts needed to sustain latch, nipple held in mouth throughout feeding, stimulation needed to elicit sucking reflex.  Audible Swallowing: A few with stimulation  Type of Nipple: Everted at rest and after stimulation  Comfort (Breast/Nipple): Soft / non-tender  Hold (Positioning): Assistance needed to correctly position infant at breast and maintain latch.  LATCH Score: 7   Lactation Tools Discussed/Used    Interventions Interventions: Education  Discharge    Consult Status Consult Status: Follow-up Date: 01/13/22 Follow-up type: In-patient    Dahlia Byes [facility] 01/13/2022, 3:26 PM

## 2022-01-13 NOTE — Progress Notes (Signed)
Labor Progress Note Jodi Sanders is a 26 y.o. G1P0000 at [redacted]w[redacted]d presented for SVD S: Patient says she has some increased pressure in her bottom that is new for her. She is wanting to be checked since she is having this new pressure.  O:  BP (!) 106/56    Pulse 82    Temp (!) 97.3 F (36.3 C) (Axillary)    Resp 18    Ht 5\' 7"  (1.702 m)    Wt 87.1 kg    LMP 03/31/2021 (Approximate)    BMI 30.07 kg/m  EFM: FHR 150s/moderate variability/+accels  CVE: Dilation: 10 Dilation Complete Date: 01/13/22 Dilation Complete Time: 0835 Effacement (%): 100 Station: 0 Presentation: Vertex Exam by:: [email or web address], CNM   A&P: 26 y.o. G1P0000 [redacted]w[redacted]d here for PDIOL #Labor: Progressing well. Expectant management. Pitocin at 10 mL currently #Pain: Epidural in place #FWB: Cat I #GBS negative   [redacted]w[redacted]d, MD Center for [facility], [facility] 8:58 AM

## 2022-01-14 ENCOUNTER — Encounter (HOSPITAL_COMMUNITY): Payer: Self-pay | Admitting: Family Medicine

## 2022-01-14 MED ORDER — ACETAMINOPHEN FOR CIRCUMCISION 160 MG/5 ML: 40.0000 mg | Freq: Once | ORAL | Status: DC

## 2022-01-14 MED ORDER — WHITE PETROLATUM EX OINT: 1.0000 "application " | TOPICAL_OINTMENT | CUTANEOUS | Status: DC | PRN

## 2022-01-14 MED ORDER — LIDOCAINE 1% INJECTION FOR CIRCUMCISION: 0.8000 mL | INJECTION | Freq: Once | INTRAVENOUS | Status: DC

## 2022-01-14 MED ORDER — ACETAMINOPHEN FOR CIRCUMCISION 160 MG/5 ML: 40.0000 mg | ORAL | Status: DC | PRN

## 2022-01-14 MED ORDER — SUCROSE 24% NICU/PEDS ORAL SOLUTION: 0.5000 mL | OROMUCOSAL | Status: DC | PRN

## 2022-01-14 MED ORDER — EPINEPHRINE TOPICAL FOR CIRCUMCISION 0.1 MG/ML: 1.0000 [drp] | TOPICAL | Status: DC | PRN

## 2022-01-14 NOTE — Lactation Note (Addendum)
This note was copied from a baby's chart. Lactation Consultation Note  Patient Name: Boy Anikka Marsan [id number] Date: 01/14/2022 Reason for consult: Follow-up assessment Age:27 hours  P1, Per Legrand Como Pecola Leisure has been sleepy today.  Baby took 13 ml of mother's milk with slow flow nipple shortly before LC arrived to room.  Discussed paced feeding and assisted with waking baby for breastfeeding.  Mother pumped 15 ml and will supplement with feedings. Laid baby in crib to wake and mother latched baby. Recommend discussing tongue cupping and short lingual frenulum with Ped MD.   Feeding Mother's Current Feeding Choice: Breast Milk  LATCH Score Latch: Repeated attempts needed to sustain latch, nipple held in mouth throughout feeding, stimulation needed to elicit sucking reflex.  Audible Swallowing: A few with stimulation  Type of Nipple: Everted at rest and after stimulation  Comfort (Breast/Nipple): Soft / non-tender  Hold (Positioning): Assistance needed to correctly position infant at breast and maintain latch.  LATCH Score: 7   Lactation Tools Discussed/Used  DEBP  Interventions Interventions: DEBP;Education;Pace feeding   Consult Status Consult Status: Follow-up Date: 01/15/22 Follow-up type: In-patient    Dahlia Byes [facility] 01/14/2022, 3:34 PM

## 2022-01-14 NOTE — Anesthesia Postprocedure Evaluation (Signed)
Anesthesia Post Note  Patient: Jodi Sanders  Procedure(s) Performed: AN AD HOC LABOR EPIDURAL     Patient location during evaluation: Mother Baby Anesthesia Type: Epidural Level of consciousness: awake, oriented and awake and alert Pain management: pain level controlled Vital Signs Assessment: post-procedure vital signs reviewed and stable Respiratory status: spontaneous breathing, respiratory function stable and nonlabored ventilation Cardiovascular status: stable Postop Assessment: no headache, adequate PO intake, able to ambulate, patient able to bend at knees, no backache and no apparent nausea or vomiting Anesthetic complications: no   No notable events documented.  Last Vitals:  Vitals:   01/13/22 2346 01/14/22 0434  BP: (!) 96/56 106/66  Pulse: 84 83  Resp: 17   Temp: 36.6 C 36.8 C  SpO2: 98% 98%    Last Pain:  Vitals:   01/14/22 0434  TempSrc: Oral  PainSc: 0-No pain   Pain Goal: Patients Stated Pain Goal: 6 (01/12/22 2030)                 Derral Colucci

## 2022-01-14 NOTE — Lactation Note (Signed)
This note was copied from a baby's chart. Lactation Consultation Note  Patient Name: Jodi Sanders [id number] Date: 01/14/2022 Reason for consult: Follow-up assessment;Mother's request;Difficult latch Age:27 hours  P1, Infant is cupping tongue with short mid anterior frenulum and thick tight labial frenulum which blanches when upper lip is flanged.  Baby has had difficulty sustaining latch but is improving and mother has good supply to supplement.  Reviewed volume guidelines.  Mother latched baby in football hold and baby was off and on breast with shallow latch and fussy. Gave baby 6 ml of colostrum with curved tip syringe and finger.  Returned baby to breast and mother was patient and was able to latch baby and sustain latch.  Left room with baby latched. Mother states she will post pump after feeding and continue to supplement with mother's milk with each feeding.    Feeding Mother's Current Feeding Choice: Breast Milk  LATCH Score Latch: Repeated attempts needed to sustain latch, nipple held in mouth throughout feeding, stimulation needed to elicit sucking reflex.  Audible Swallowing: A few with stimulation  Type of Nipple: Everted at rest and after stimulation (short shaft)  Comfort (Breast/Nipple): Soft / non-tender  Hold (Positioning): Assistance needed to correctly position infant at breast and maintain latch.  LATCH Score: 7   Lactation Tools Discussed/Used  DEBP and hand free   Interventions Interventions: Assisted with latch;Skin to skin;Hand express;Breast compression;Adjust position;Support pillows;Position options;DEBP;Education  Discharge Pump: DEBP;Personal (Hands free and Medela)  Consult Status Consult Status: Follow-up Date: 01/15/22 Follow-up type: In-patient    Dahlia Byes [facility] 01/14/2022, 10:02 AM

## 2022-01-14 NOTE — Progress Notes (Signed)
POSTPARTUM PROGRESS NOTE  Post Partum Day 1  Subjective:  Jodi Sanders is a 26 y.o. G1P1001 s/p VD at [redacted]w[redacted]d.  She reports she is doing well. No acute events overnight. She denies any problems with ambulating, voiding or po intake. Denies nausea or vomiting.  Pain is well controlled.  Lochia is mild.  Objective: Blood pressure 106/66, pulse 83, temperature 98.3 F (36.8 C), temperature source Oral, resp. rate 17, height 5\' 7"  (1.702 m), weight 87.1 kg, last menstrual period 03/31/2021, SpO2 98 %, unknown if currently breastfeeding.  Physical Exam:  General: alert, cooperative and no distress Chest: no respiratory distress Heart:regular rate, distal pulses intact Uterine Fundus: firm, appropriately tender DVT Evaluation: No calf swelling or tenderness Extremities: minimal edema Skin: warm, dry  Recent Labs    01/12/22 0823  HGB 10.8*  HCT 32.7*    Assessment/Plan: Jodi Sanders is a 26 y.o. G1P1001 s/p VD at [redacted]w[redacted]d   PPD#1 - Doing well  Routine postpartum care  Contraception: nexplanon at pp visit  Feeding: breast  Consented mom for infant circ, see infant chart.   Dispo: Plan for discharge tomorrow. If both mom and infant doing well, could consider discharge home later today if desired.    LOS: 2 days   [redacted]w[redacted]d, DO  [facility] Fellow  01/14/2022, 7:37 AM

## 2022-01-15 MED ORDER — IBUPROFEN 600 MG PO TABS: 600.0000 mg | ORAL_TABLET | Freq: Four times a day (QID) | ORAL | 0 refills | Status: DC

## 2022-01-15 NOTE — Lactation Note (Signed)
This note was copied from a baby's chart. Lactation Consultation Note  Patient Name: Jodi Sanders [id number] Date: 01/15/2022 Reason for consult: Follow-up assessment Age:27 hours  P1, Mother states latch continues to improve and baby was able to sustain latch for 15-20 min once last night.  She is pumping approx 20-25 ml per session and supplementing baby.  Provided nfant white nipple to slow flow of bottle.  Provided resource sheet if needed for frenulum.  Reviewed engorgement care and monitoring voids/stools.  Suggest calling if latch assistance is needed today.    Feeding Mother's Current Feeding Choice: Breast Milk   Lactation Tools Discussed/Used Tools: Pump;Shells;Bottle Breast pump type: Double-Electric Breast Pump;Manual Reason for Pumping: stimualtion and supplementation  Interventions Interventions: Education  Discharge Pump: DEBP;Personal (Hands free and Medela)  Consult Status Consult Status: Complete Date: 01/15/22    Dahlia Byes [facility] 01/15/2022, 8:37 AM

## 2022-01-24 ENCOUNTER — Telehealth (HOSPITAL_COMMUNITY): Payer: Self-pay | Admitting: *Deleted

## 2022-01-24 NOTE — Telephone Encounter (Signed)
Mom reports feeling good. No concerns about herself at this time. EPDS=7 (No hospital score) ?Mom reports baby is doing well. Feeding, peeing, and pooping without difficulty. Safe sleep reviewed. Mom reports no concerns about baby at present. ? ?Duffy Rhody, RN 01-24-2022 at 2:41pm ?

## 2022-02-22 ENCOUNTER — Ambulatory Visit (INDEPENDENT_AMBULATORY_CARE_PROVIDER_SITE_OTHER): Payer: 59 | Admitting: Advanced Practice Midwife

## 2022-02-22 NOTE — Progress Notes (Signed)
? ? ?  Post Partum Visit Note ? ?Jodi Sanders is a 26 y.o. G39P1001 female who presents for a postpartum visit. She is 6 weeks and 4 days postpartum following a normal spontaneous vaginal delivery.  I have fully reviewed the prenatal and intrapartum course. The delivery was at [redacted]w[redacted]d gestational weeks.  Anesthesia: epidural. Postpartum course has been going well per patient. Baby is doing well per patient. Baby is feeding by breast. Bleeding no bleeding. Bowel function is normal. Bladder function is normal. Patient is not sexually active. Contraception method is none. Postpartum depression screening: negative. ? ? ?The pregnancy intention screening data noted above was reviewed. Potential methods of contraception were discussed. The patient elected to proceed with Nexplanon but declines placement today. ? ?Health Maintenance Due  ?Topic Date Due  ? COVID-19 Vaccine (1) Never done  ? ? ?The following portions of the patient's history were reviewed and updated as appropriate: allergies, current medications, past family history, past medical history, past social history, past surgical history, and problem list. ? ?Review of Systems ?A comprehensive review of systems was negative. ? ?Objective:  ?LMP 03/31/2021 (Approximate)   ? ?General:  alert, cooperative, appears stated age, and no distress  ? Breasts:  not indicated  ?Lungs: Normal work of breathing  ?Heart:  normal apical impulse  ?Abdomen: Not indicated    ?Wound N/A  ?GU exam:  not indicated  ?     ?Assessment:  ? ?Normal postpartum exam ?Plan for Nexplanon placement at patient's convenience ?Breastfeeding, declines breast exam ? ? ?Plan:  ? ?Essential components of care per ACOG recommendations: ? ?1.  Mood and well being: Patient with negative depression screening today. Reviewed local resources for support.  ?- Patient tobacco use? No.   ?- hx of drug use? No.   ? ?2. Infant care and feeding:  ?-Patient currently breastmilk feeding?  ?-Social determinants of  health (SDOH) reviewed in EPIC. No concerns ? ?3. Sexuality, contraception and birth spacing ?- Patient does not want a pregnancy in the next year.   Patient desired Nexplanon ? ?4. Sleep and fatigue ?-Encouraged family/partner/community support of 4 hrs of uninterrupted sleep to help with mood and fatigue ? ?5. Physical Recovery  ?- Discussed patients delivery and complications. She describes her labor as good. ?- Patient had a Vaginal, no problems at delivery. Patient had a 2nd degree laceration. Perineal healing reviewed. Patient expressed understanding ?- Patient has urinary incontinence? No. ?- Patient is safe to resume physical and sexual activity ? ?6.  Health Maintenance ?- HM due items addressed Yes ?- Last pap smear  ?Diagnosis  ?Date Value Ref Range Status  ?03/21/2021     ? - Negative for intraepithelial lesion or malignancy (NILM)  ? Pap smear not done at today's visit.  ?-Breast Cancer screening indicated? No.  ? ?7. Chronic Disease/Pregnancy Condition follow up: None ? ?- PCP follow up ? ?Patient with mild pain with passing stool. Planning to start Colace but not sure which pharmacy is used by her insurance. She will call the office to confirm pharmacy so rx can be sent. ? ?Mallie Snooks, Dawn, MSN, CNM ?Certified Nurse Midwife, [place] ?[facility] ?  ?

## 2022-03-26 ENCOUNTER — Encounter: Payer: No Typology Code available for payment source | Admitting: Certified Nurse Midwife

## 2022-04-04 ENCOUNTER — Ambulatory Visit (INDEPENDENT_AMBULATORY_CARE_PROVIDER_SITE_OTHER): Payer: 59 | Admitting: Family Medicine

## 2022-04-04 ENCOUNTER — Encounter: Payer: Self-pay | Admitting: Family Medicine

## 2022-04-04 VITALS — BP 99/52 | HR 78

## 2022-04-04 DIAGNOSIS — K6289 Other specified diseases of anus and rectum: Secondary | ICD-10-CM

## 2022-04-04 DIAGNOSIS — K645 Perianal venous thrombosis: Secondary | ICD-10-CM

## 2022-04-04 MED ORDER — PROCTOFOAM HC 1-1 % EX FOAM: 1.0000 | Freq: Two times a day (BID) | CUTANEOUS | 1 refills | Status: AC

## 2022-04-04 MED ORDER — AMOXICILLIN-POT CLAVULANATE 875-125 MG PO TABS: 1.0000 | ORAL_TABLET | Freq: Two times a day (BID) | ORAL | 0 refills | Status: AC

## 2022-04-04 NOTE — Progress Notes (Signed)
? ? ?  Subjective:  ? ? Patient ID: Jodi Sanders is a 26 y.o. female presenting with Rectal Bleeding ? on 04/04/2022 ? ?HPI: ?Having painful defecation with blood. Feels like she has something inside the rectum which willl not allow it to come out. Stool varies from hard to soft. Blood is on toile paper wen wiping. No maroon or black stool. ? ?Review of Systems  ?Constitutional:  Negative for chills and fever.  ?Respiratory:  Negative for shortness of breath.   ?Cardiovascular:  Negative for chest pain.  ?Gastrointestinal:  Negative for abdominal pain, nausea and vomiting.  ?Genitourinary:  Negative for dysuria.  ?Skin:  Negative for rash.  ?   ?Objective:  ?  ?BP (!) 99/52   Pulse 78   Breastfeeding Yes  ?Physical Exam ?Exam conducted with a chaperone present.  ?Constitutional:   ?   General: She is not in acute distress. ?   Appearance: She is well-developed.  ?HENT:  ?   Head: Normocephalic and atraumatic.  ?Eyes:  ?   General: No scleral icterus. ?Cardiovascular:  ?   Rate and Rhythm: Normal rate.  ?Pulmonary:  ?   Effort: Pulmonary effort is normal.  ?Abdominal:  ?   Palpations: Abdomen is soft.  ?Genitourinary: ?   Rectum: External hemorrhoid present. Normal anal tone.  ?   Comments: Tender to palpation on left ?Musculoskeletal:  ?   Cervical back: Neck supple.  ?Skin: ?   General: Skin is warm and dry.  ?Neurological:  ?   Mental Status: She is alert and oriented to person, place, and time.  ? ? ? ?   ?Assessment & Plan:  ?Rectal pain - ? small crypt abscess--will presumptively treat - Plan: amoxicillin-clavulanate (AUGMENTIN) 875-125 MG tablet ? ?Perianal venous thrombosis - Proctofoam--if not better in 7 days--consider GI referral. - Plan: hydrocortisone-pramoxine (PROCTOFOAM HC) rectal foam ? ? ?Return if symptoms worsen or fail to improve. ? ?Donnamae Jude, MD ?04/04/2022 ?1:49 PM ? ? ?

## 2022-04-04 NOTE — Progress Notes (Signed)
Having issues with BM, very painful and bleeding that started a month ago.  ? ?Struggling with getting it to move  ?

## 2022-08-01 ENCOUNTER — Telehealth: Payer: Self-pay | Admitting: *Deleted

## 2022-08-01 MED ORDER — DICLOXACILLIN SODIUM 500 MG PO CAPS: 500.0000 mg | ORAL_CAPSULE | Freq: Four times a day (QID) | ORAL | 0 refills | Status: AC

## 2022-08-01 NOTE — Telephone Encounter (Signed)
Pt called concerned she may have a blocked duct and or mastitis. Pt had a fever of 101 last night and has had a tender swollen breast for 3 days. Discussed with Dr Shawnie Pons and will send in antibiotics.

## 2023-07-29 ENCOUNTER — Ambulatory Visit: Payer: 59

## 2023-08-01 ENCOUNTER — Ambulatory Visit: Payer: No Typology Code available for payment source
